# Patient Record
Sex: Male | Born: 1990 | Race: White | Hispanic: Yes | Marital: Married | Smoking: Never smoker
Health system: Southern US, Community
[De-identification: ages and names within clinical notes are randomized; demographics above are authoritative.]

## PROBLEM LIST (undated history)

## (undated) DIAGNOSIS — G4730 Sleep apnea, unspecified: Secondary | ICD-10-CM

## (undated) DIAGNOSIS — E669 Obesity, unspecified: Secondary | ICD-10-CM

## (undated) DIAGNOSIS — R519 Headache, unspecified: Secondary | ICD-10-CM

## (undated) DIAGNOSIS — K219 Gastro-esophageal reflux disease without esophagitis: Secondary | ICD-10-CM

## (undated) DIAGNOSIS — M51369 Other intervertebral disc degeneration, lumbar region without mention of lumbar back pain or lower extremity pain: Secondary | ICD-10-CM

## (undated) DIAGNOSIS — E119 Type 2 diabetes mellitus without complications: Secondary | ICD-10-CM

## (undated) DIAGNOSIS — D649 Anemia, unspecified: Secondary | ICD-10-CM

## (undated) DIAGNOSIS — M5136 Other intervertebral disc degeneration, lumbar region: Secondary | ICD-10-CM

## (undated) HISTORY — PX: NO PAST SURGERIES: SHX2092

## (undated) HISTORY — PX: VASECTOMY: SHX75

## (undated) HISTORY — PX: OTHER SURGICAL HISTORY: SHX169

---

## 2013-10-02 DIAGNOSIS — IMO0002 Reserved for concepts with insufficient information to code with codable children: Secondary | ICD-10-CM | POA: Insufficient documentation

## 2013-10-02 DIAGNOSIS — M5116 Intervertebral disc disorders with radiculopathy, lumbar region: Secondary | ICD-10-CM | POA: Insufficient documentation

## 2015-04-21 DIAGNOSIS — E119 Type 2 diabetes mellitus without complications: Secondary | ICD-10-CM | POA: Insufficient documentation

## 2015-09-17 DIAGNOSIS — M5126 Other intervertebral disc displacement, lumbar region: Secondary | ICD-10-CM | POA: Insufficient documentation

## 2016-01-29 DIAGNOSIS — Z6841 Body Mass Index (BMI) 40.0 and over, adult: Secondary | ICD-10-CM | POA: Insufficient documentation

## 2016-01-29 DIAGNOSIS — M5137 Other intervertebral disc degeneration, lumbosacral region: Secondary | ICD-10-CM | POA: Insufficient documentation

## 2016-01-29 DIAGNOSIS — R0683 Snoring: Secondary | ICD-10-CM | POA: Insufficient documentation

## 2016-01-29 DIAGNOSIS — M51379 Other intervertebral disc degeneration, lumbosacral region without mention of lumbar back pain or lower extremity pain: Secondary | ICD-10-CM | POA: Insufficient documentation

## 2016-01-30 DIAGNOSIS — E119 Type 2 diabetes mellitus without complications: Secondary | ICD-10-CM | POA: Insufficient documentation

## 2016-03-16 DIAGNOSIS — F329 Major depressive disorder, single episode, unspecified: Secondary | ICD-10-CM | POA: Insufficient documentation

## 2016-06-30 ENCOUNTER — Emergency Department (HOSPITAL_COMMUNITY): Payer: Managed Care, Other (non HMO)

## 2016-06-30 ENCOUNTER — Emergency Department (HOSPITAL_COMMUNITY)
Admission: EM | Admit: 2016-06-30 | Discharge: 2016-06-30 | Disposition: A | Discharge status: Home / Self Care | Payer: Managed Care, Other (non HMO) | Attending: Emergency Medicine | Admitting: Emergency Medicine

## 2016-06-30 ENCOUNTER — Encounter (HOSPITAL_COMMUNITY): Payer: Self-pay | Admitting: Emergency Medicine

## 2016-06-30 DIAGNOSIS — E119 Type 2 diabetes mellitus without complications: Secondary | ICD-10-CM | POA: Insufficient documentation

## 2016-06-30 DIAGNOSIS — R0789 Other chest pain: Secondary | ICD-10-CM | POA: Diagnosis not present

## 2016-06-30 DIAGNOSIS — R1011 Right upper quadrant pain: Secondary | ICD-10-CM | POA: Insufficient documentation

## 2016-06-30 HISTORY — DX: Type 2 diabetes mellitus without complications: E11.9

## 2016-06-30 HISTORY — DX: Sleep apnea, unspecified: G47.30

## 2016-06-30 HISTORY — DX: Obesity, unspecified: E66.9

## 2016-06-30 LAB — URINALYSIS, ROUTINE W REFLEX MICROSCOPIC
BILIRUBIN URINE: NEGATIVE
Glucose, UA: NEGATIVE mg/dL
Hgb urine dipstick: NEGATIVE
Ketones, ur: NEGATIVE mg/dL
LEUKOCYTES UA: NEGATIVE
NITRITE: NEGATIVE
Protein, ur: NEGATIVE mg/dL
SPECIFIC GRAVITY, URINE: 1.02 (ref 1.005–1.030)
pH: 6 (ref 5.0–8.0)

## 2016-06-30 LAB — COMPREHENSIVE METABOLIC PANEL
ALK PHOS: 60 U/L (ref 38–126)
ALT: 87 U/L — ABNORMAL HIGH (ref 17–63)
ANION GAP: 7 (ref 5–15)
AST: 41 U/L (ref 15–41)
Albumin: 3.9 g/dL (ref 3.5–5.0)
BILIRUBIN TOTAL: 0.5 mg/dL (ref 0.3–1.2)
BUN: 9 mg/dL (ref 6–20)
CALCIUM: 8.9 mg/dL (ref 8.9–10.3)
CO2: 27 mmol/L (ref 22–32)
Chloride: 104 mmol/L (ref 101–111)
Creatinine, Ser: 0.96 mg/dL (ref 0.61–1.24)
Glucose, Bld: 174 mg/dL — ABNORMAL HIGH (ref 65–99)
Potassium: 4.1 mmol/L (ref 3.5–5.1)
Sodium: 138 mmol/L (ref 135–145)
Total Protein: 6.9 g/dL (ref 6.5–8.1)

## 2016-06-30 LAB — CBC
HCT: 45.2 % (ref 39.0–52.0)
HEMOGLOBIN: 15.1 g/dL (ref 13.0–17.0)
MCH: 30.4 pg (ref 26.0–34.0)
MCHC: 33.4 g/dL (ref 30.0–36.0)
MCV: 90.9 fL (ref 78.0–100.0)
Platelets: 265 10*3/uL (ref 150–400)
RBC: 4.97 MIL/uL (ref 4.22–5.81)
RDW: 13.5 % (ref 11.5–15.5)
WBC: 12.1 10*3/uL — AB (ref 4.0–10.5)

## 2016-06-30 LAB — LIPASE, BLOOD: Lipase: 29 U/L (ref 11–51)

## 2016-06-30 MED ORDER — IOPAMIDOL (ISOVUE-300) INJECTION 61%
INTRAVENOUS | Status: AC
  Administered 2016-06-30: 100 mL via INTRAVENOUS
  Filled 2016-06-30: qty 100

## 2016-06-30 MED ORDER — HYDROMORPHONE HCL 2 MG/ML IJ SOLN
1.0000 mg | Freq: Once | INTRAMUSCULAR | Status: AC
  Administered 2016-06-30: 1 mg via INTRAVENOUS
  Filled 2016-06-30: qty 1

## 2016-06-30 MED ORDER — ONDANSETRON 4 MG PO TBDP
4.0000 mg | ORAL_TABLET | Freq: Once | ORAL | Status: AC | PRN
  Administered 2016-06-30: 4 mg via ORAL

## 2016-06-30 MED ORDER — ONDANSETRON 4 MG PO TBDP
ORAL_TABLET | ORAL | Status: AC
  Filled 2016-06-30: qty 1

## 2016-06-30 MED ORDER — FENTANYL CITRATE (PF) 100 MCG/2ML IJ SOLN
100.0000 ug | Freq: Once | INTRAMUSCULAR | Status: AC
  Administered 2016-06-30: 100 ug via INTRAVENOUS
  Filled 2016-06-30: qty 2

## 2016-06-30 MED ORDER — ONDANSETRON HCL 4 MG/2ML IJ SOLN
4.0000 mg | Freq: Once | INTRAMUSCULAR | Status: AC
  Administered 2016-06-30: 4 mg via INTRAVENOUS
  Filled 2016-06-30: qty 2

## 2016-06-30 MED ORDER — FAMOTIDINE IN NACL 20-0.9 MG/50ML-% IV SOLN
20.0000 mg | Freq: Once | INTRAVENOUS | Status: AC
  Administered 2016-06-30: 20 mg via INTRAVENOUS
  Filled 2016-06-30: qty 50

## 2016-06-30 NOTE — ED Notes (Signed)
Pt had "reaction" to fentanyl; pt got very tearful, anxious, and stated he was feeling very sad.  Has had this same "reaction" to dilaudid.  Sts he reacts this way to all narcotics and did not complete narcotic prescriptions when given them in the past.

## 2016-06-30 NOTE — ED Provider Notes (Signed)
[facility]ENC[facility] DEPT Provider Note   CSN: [id number] Arrival date & time: 06/30/16  0535     History   Chief Complaint Chief Complaint  Patient presents with  . Abdominal Pain    RUQ    HPI Deleon Boesen is a 26 y.o. male.  The history is provided by the patient and a significant other.  Abdominal Pain   This is a new problem. The current episode started 3 to 5 hours ago. The problem occurs constantly. The problem has been rapidly worsening. The pain is associated with an unknown factor. The pain is located in the RUQ. The pain is severe. Associated symptoms include nausea. Pertinent negatives include fever and vomiting. The symptoms are aggravated by palpation. Nothing relieves the symptoms.  Patient reports abrupt onset of RUQ pain tonight while sleeping It radiates into his right shoulder He reports intense pain and nausea He reports he feels like he was "punched" in the liver He has never had this before He does not abuse ETOH   Past Medical History:  Diagnosis Date  . Diabetes mellitus without complication (HCC)   . Obese   . Sleep apnea     There are no active problems to display for this patient.   History reviewed. No pertinent surgical history.     Home Medications    Prior to Admission medications   Not on File    Family History History reviewed. No pertinent family history.  Social History Social History  Substance Use Topics  . Smoking status: Never Smoker  . Smokeless tobacco: Never Used  . Alcohol use Yes     Comment: socially     Allergies   Patient has no known allergies.   Review of Systems Review of Systems  Constitutional: Negative for fever.  Respiratory: Positive for cough.   Gastrointestinal: Positive for abdominal pain and nausea. Negative for vomiting.  All other systems reviewed and are negative.    Physical Exam Updated Vital Signs BP (!) 147/107 (BP Location: Left Arm)   Pulse 103   Temp 98.7 F (37.1 C)  (Oral)   Resp 22   Ht 5\' 8"  (1.727 m)   Wt (!) 158.8 kg   SpO2 100%   BMI 53.22 kg/m   Physical Exam CONSTITUTIONAL: Well developed/well nourished, uncomfortable appearing, writhing in the bed HEAD: Normocephalic/atraumatic EYES: EOMI/PERRL, no icterus ENMT: Mucous membranes moist NECK: supple no meningeal signs SPINE/BACK:entire spine nontender CV: S1/S2 noted, no murmurs/rubs/gallops noted LUNGS: Lungs are clear to auscultation bilaterally, no apparent distress ABDOMEN: soft, moderate RUQ tenderness, +murphy sign, no rebound or guarding, bowel sounds noted throughout abdomen GU:no cva tenderness NEURO: Pt is awake/alert/appropriate, moves all extremitiesx4.  No facial droop.   EXTREMITIES: pulses normal/equal, full ROM SKIN: warm, color normal PSYCH: anxious  ED Treatments / Results  Labs (all labs ordered are listed, but only abnormal results are displayed) Labs Reviewed  COMPREHENSIVE METABOLIC PANEL - Abnormal; Notable for the following:       Result Value   Glucose, Bld 174 (*)    ALT 87 (*)    All other components within normal limits  CBC - Abnormal; Notable for the following:    WBC 12.1 (*)    All other components within normal limits  LIPASE, BLOOD  URINALYSIS, ROUTINE W REFLEX MICROSCOPIC    EKG  EKG Interpretation None       Radiology No results found.  Procedures Procedures  Medications Ordered in ED Medications  ondansetron (ZOFRAN-ODT) disintegrating tablet 4  mg (4 mg Oral Given 06/30/16 0543)  fentaNYL (SUBLIMAZE) injection 100 mcg (100 mcg Intravenous Given 06/30/16 0651)  ondansetron (ZOFRAN) injection 4 mg (4 mg Intravenous Given 06/30/16 [id number])     Initial Impression / Assessment and Plan / ED Course  I have reviewed the triage vital signs and the nursing notes.  Pertinent labs results that were available during my care of the patient were reviewed by me and considered in my medical decision making (see chart for details).    7:27  AM At signout to dr Adela Lankfloyd, f/u on US imaging as patient with likely biliary colic   Final Clinical Impressions(s) / ED Diagnoses   Final diagnoses:  RUQ pain    New Prescriptions New Prescriptions   No medications on file     Zadie Rhineonald Judeen Geralds, MD 06/30/16 [phone]

## 2016-06-30 NOTE — ED Triage Notes (Signed)
Pt presents with acute onset RUQ abd pain at 3am; pt states "I feel like I have been punched in the liver"; pt also reports some nausea on the way here

## 2016-06-30 NOTE — Discharge Instructions (Signed)
Take 4 over the counter ibuprofen tablets 3 times a day or 2 over-the-counter naproxen tablets twice a day for pain. Also take tylenol 1000mg(2 extra strength) four times a day.    

## 2016-06-30 NOTE — ED Notes (Signed)
Returned from xray

## 2016-06-30 NOTE — ED Notes (Signed)
Transported to CT 

## 2016-06-30 NOTE — ED Provider Notes (Signed)
I received the patient in signout from Dr. Bebe ShaggyWickline. Briefly patient is a 26 year old male with sudden onset right upper quadrant abdominal pain radiating to his back. This woke him up from sleep. Never had pain like this before. Plan was to follow-up on right upper quadrant ultrasound. This study was negative. Patient still with severe right upper quadrant tenderness. Will obtain a CT scan with contrast. Redosed pain medicines. Will give Pepcid.  Patient with some symptomatic improvement, CT negative.  Able to tolerate PO.  D/c home.     I have discussed the diagnosis/risks/treatment options with the patient and believe the pt to be eligible for discharge home to follow-up with PCP. We also discussed returning to the ED immediately if new or worsening sx occur. We discussed the sx which are most concerning (e.g., sudden worsening pain, fever, inability to tolerate by mouth) that necessitate immediate return. Medications administered to the patient during their visit and any new prescriptions provided to the patient are listed below.  Medications given during this visit Medications  ondansetron (ZOFRAN-ODT) disintegrating tablet 4 mg (4 mg Oral Given 06/30/16 0543)  fentaNYL (SUBLIMAZE) injection 100 mcg (100 mcg Intravenous Given 06/30/16 0651)  ondansetron (ZOFRAN) injection 4 mg (4 mg Intravenous Given 06/30/16 0648)  HYDROmorphone (DILAUDID) injection 1 mg (1 mg Intravenous Given 06/30/16 0837)  ondansetron (ZOFRAN) injection 4 mg (4 mg Intravenous Given 06/30/16 0838)  famotidine (PEPCID) IVPB 20 mg premix (0 mg Intravenous Stopped 06/30/16 1002)  iopamidol (ISOVUE-300) 61 % injection (100 mLs Intravenous Contrast Given 06/30/16 0912)     The patient appears reasonably screen and/or stabilized for discharge and I doubt any other medical condition or other [facility] requiring further screening, evaluation, or treatment in the ED at this time prior to discharge.     Melene Planan Edythe Riches, DO 06/30/16 1503

## 2017-03-04 DIAGNOSIS — G4733 Obstructive sleep apnea (adult) (pediatric): Secondary | ICD-10-CM | POA: Insufficient documentation

## 2017-06-22 IMAGING — US US ABDOMEN LIMITED
1 series · 14 of 25 positions shown · non-contrast
Comparison: None.

CLINICAL DATA: Right upper quadrant pain and nausea since 3 a.m.
this morning.

EXAM:
US ABDOMEN LIMITED - RIGHT UPPER QUADRANT

[Series 1: us abdomen limited · 0.26mm/px · 14 of 37 slices shown]
[im 1/37]
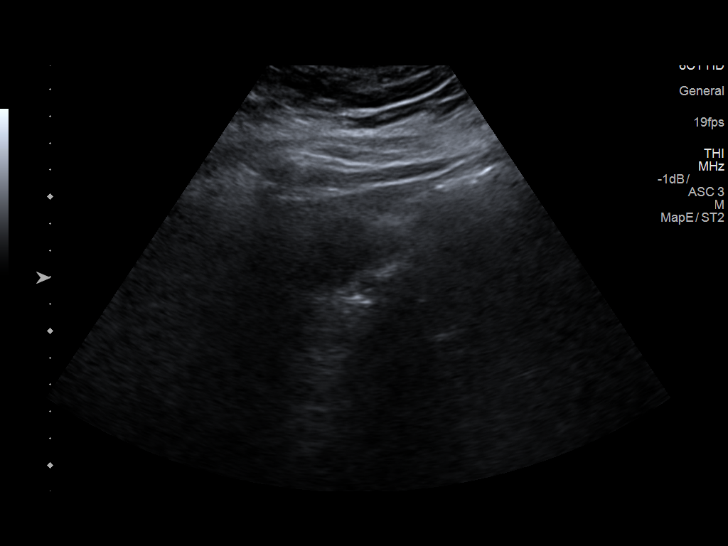
[im 4/37]
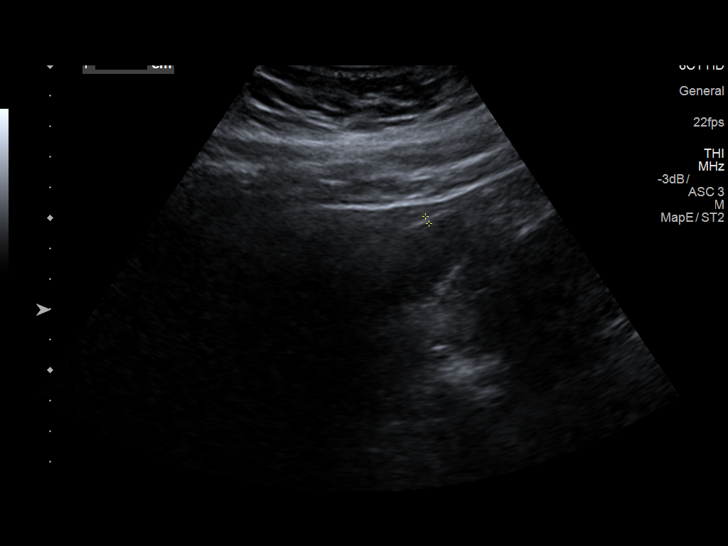
[im 7/37]
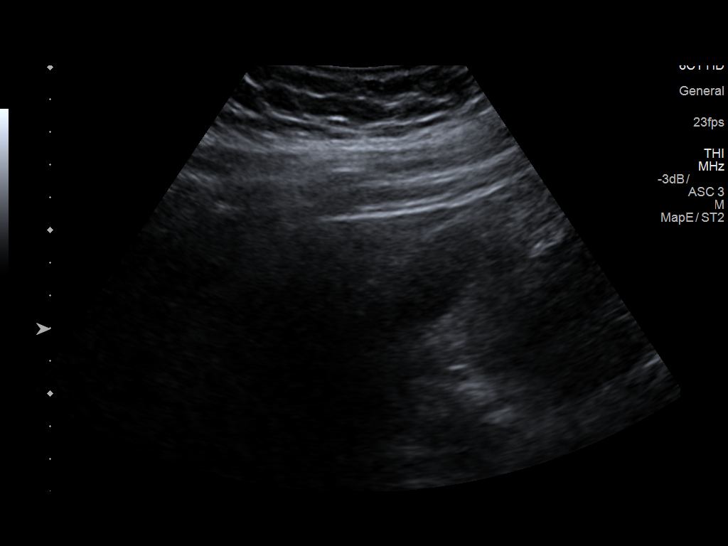
[im 10/37]
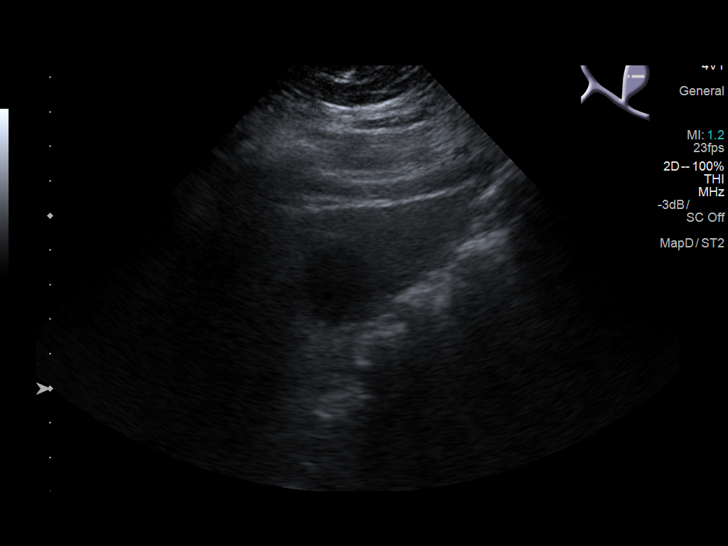
[im 13/37]
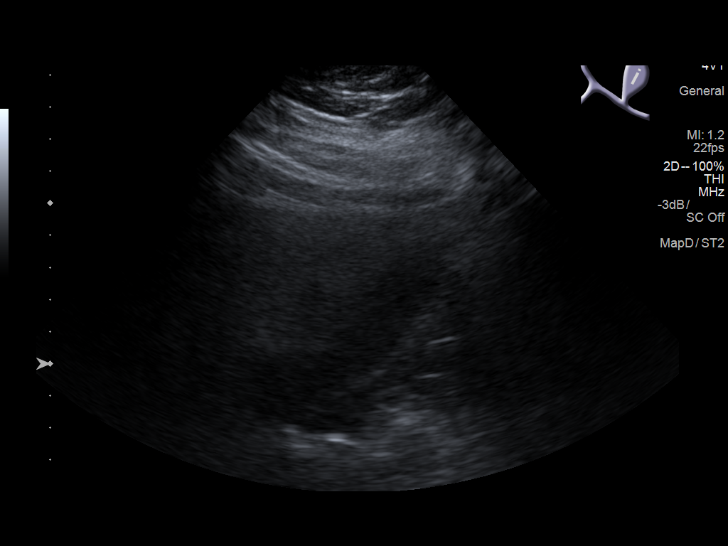
[im 14/37]
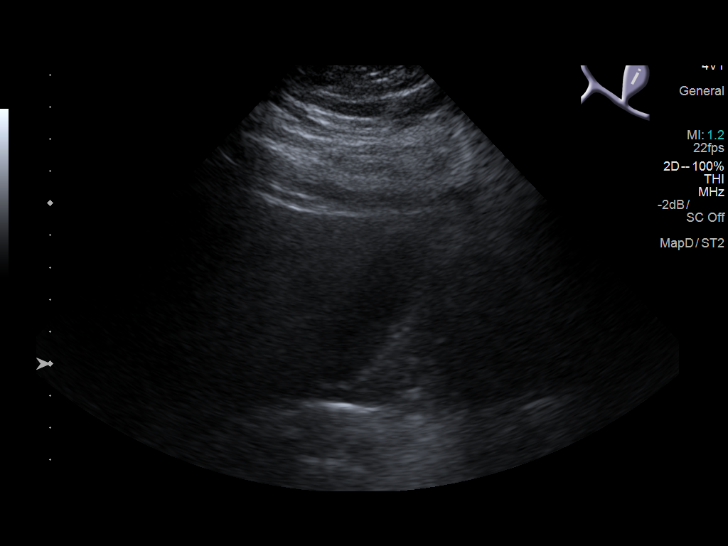
[im 17/37]
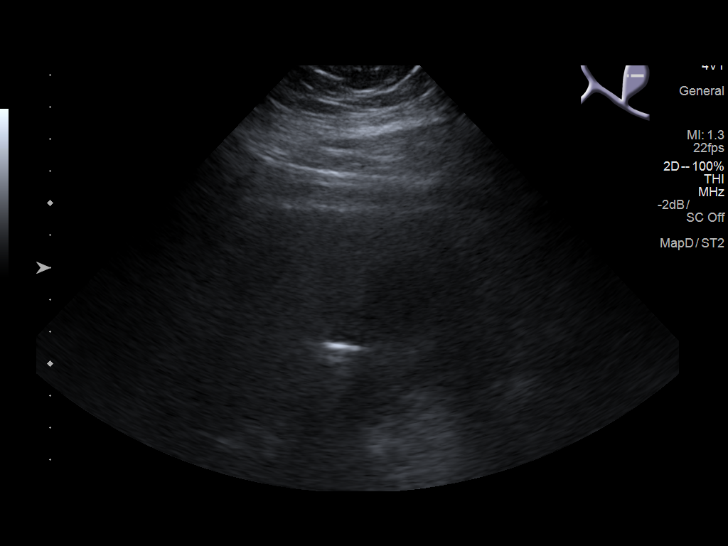
[im 20/37]
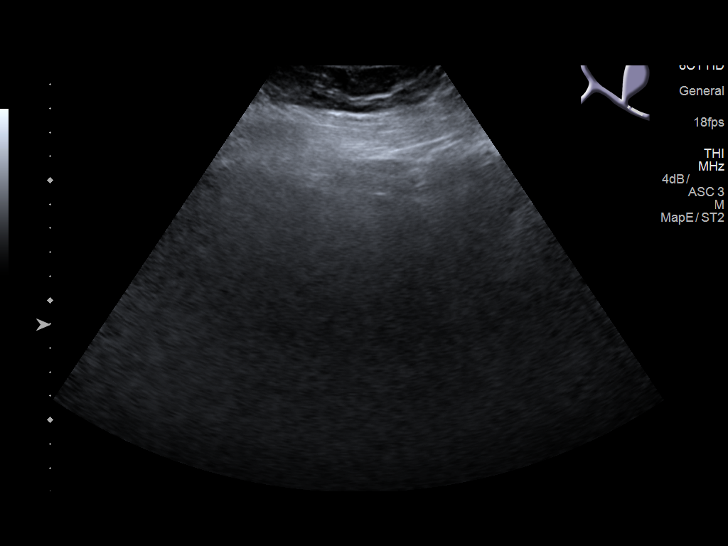
[im 23/37]
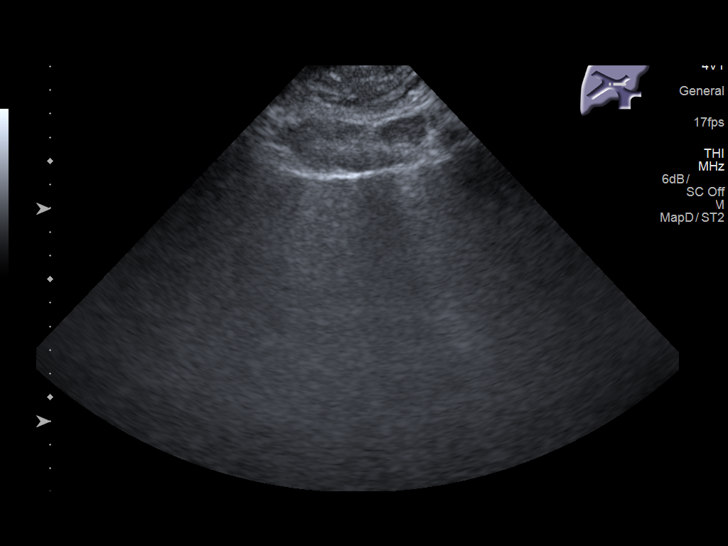
[im 25/37]
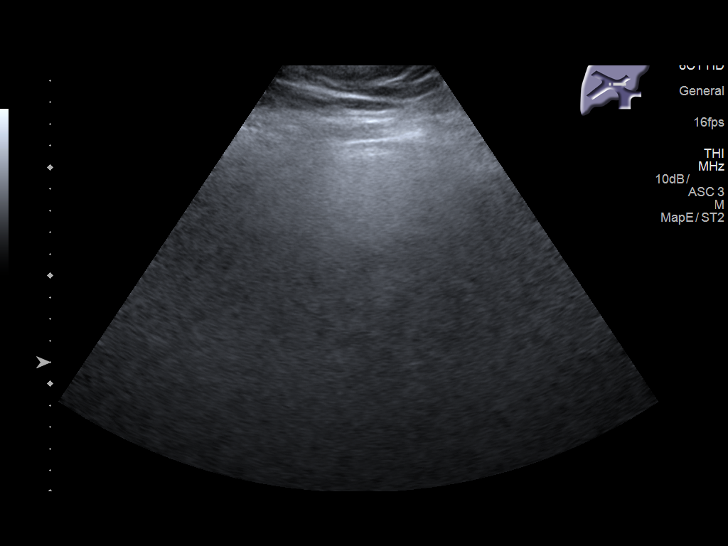
[im 28/37]
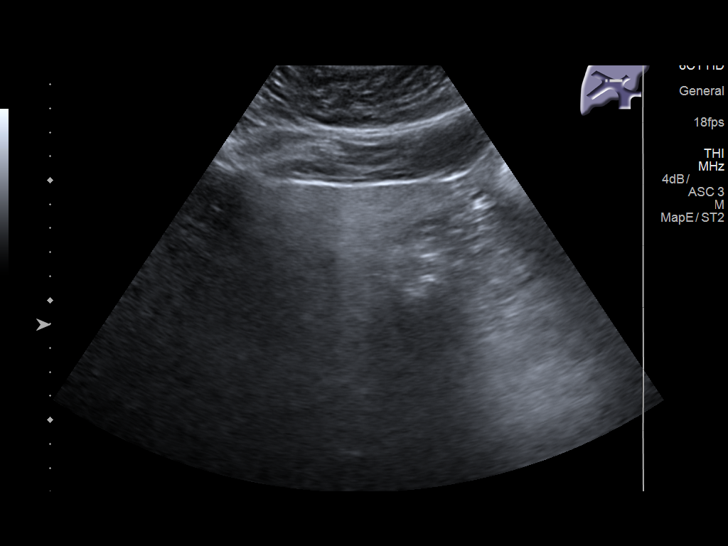
[im 31/37]
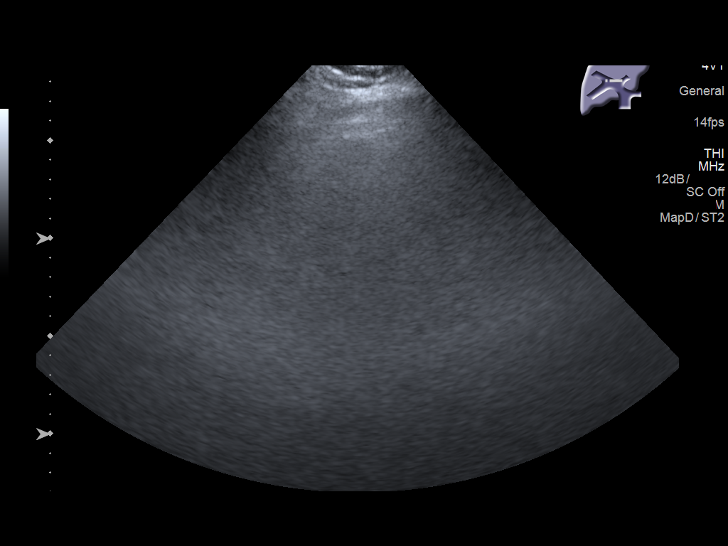
[im 34/37]
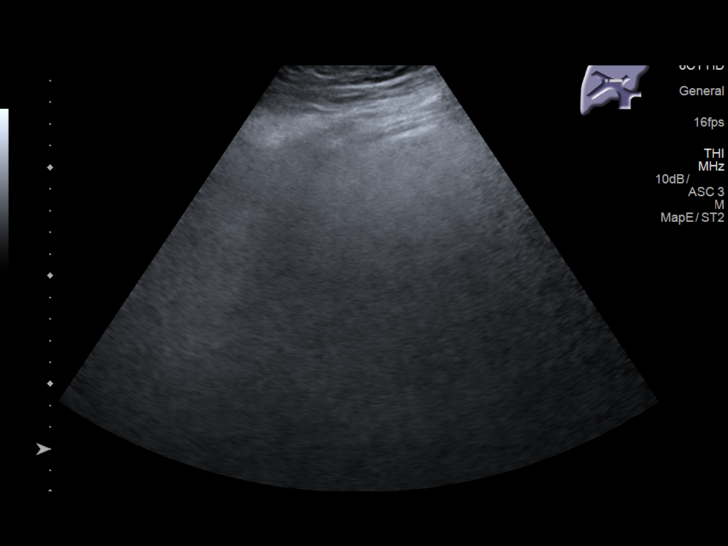
[im 37/37]
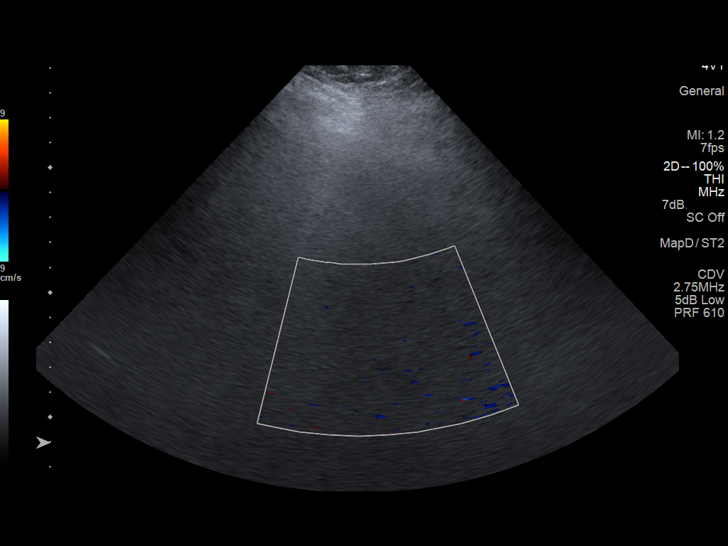

[14 of 25 positions shown; findings below may reference images not displayed]

FINDINGS: Gallbladder:

No gallstones or wall thickening visualized. No sonographic Murphy
sign noted by sonographer.

Common bile duct:

Not visualized.

Liver:

The liver is dense and demonstrates increased echogenicity.
IMPRESSION: Limited study due to the patient's body habitus.

Negative for gallstones or acute abnormality.

Fatty infiltration of the liver.

## 2018-11-13 IMAGING — CT CT ABD-PELV W/ CM
2 of 4 series · 9 of 46 positions shown, 10 images · IV contrast (Iodine)
Comparison: None.

CLINICAL DATA: Severe right upper quadrant pain radiating to right
lower quadrant

EXAM:
CT ABDOMEN AND PELVIS WITH CONTRAST
TECHNIQUE: Multidetector CT imaging of the abdomen and pelvis was performed
using the standard protocol following bolus administration of
intravenous contrast.
CONTRAST:  100 mL Isovue 300 IV

[Series 201: routine, idose (2) · axial · 0.96mm/px · z∈[+114,+499]mm · 6 of 93 slices shown, 7 images]
[im 8/93  soft-tissue]
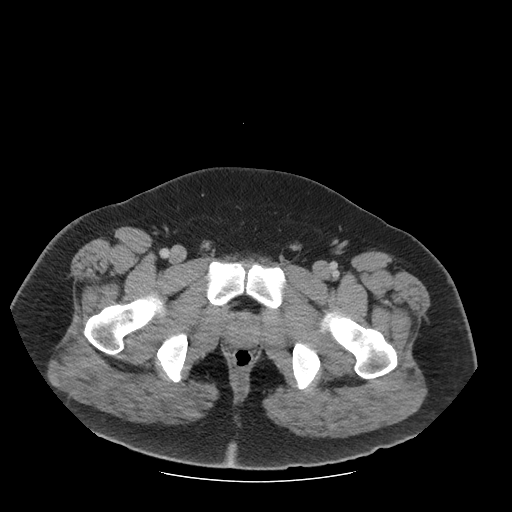
[im 8/93  bone]
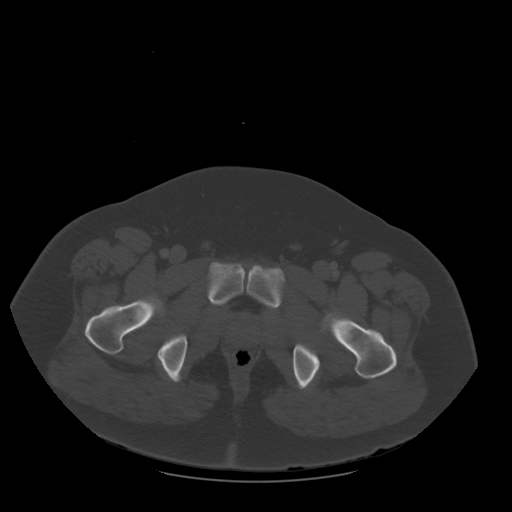
[im 24/93  soft-tissue]
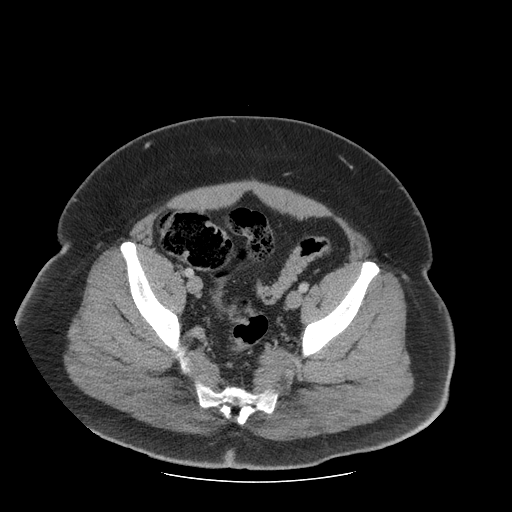
[im 39/93  soft-tissue]
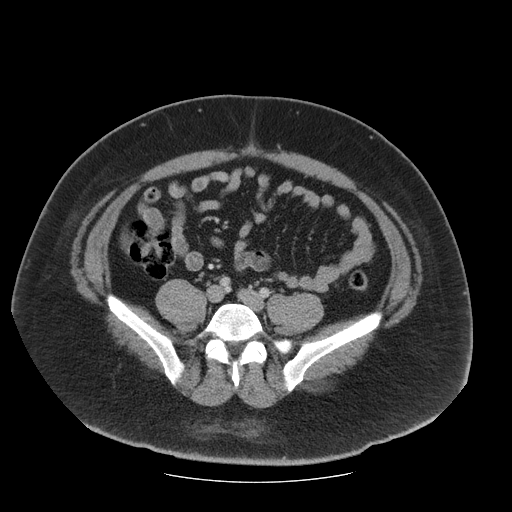
[im 54/93  soft-tissue]
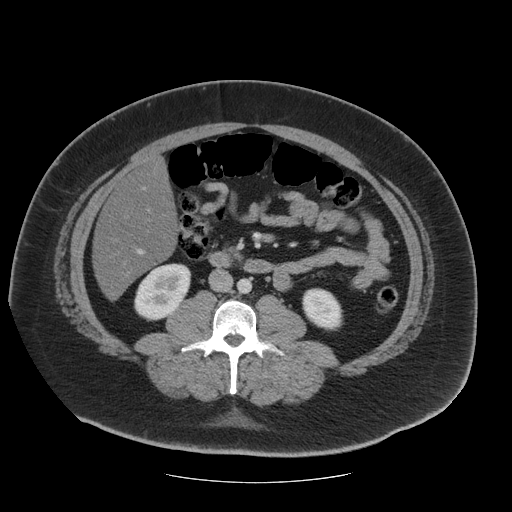
[im 70/93  soft-tissue]
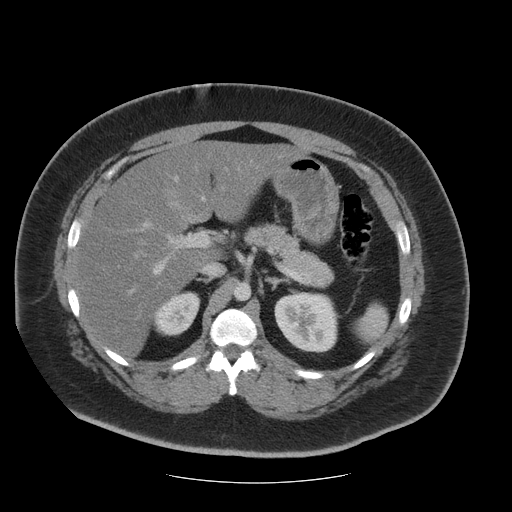
[im 85/93  soft-tissue]
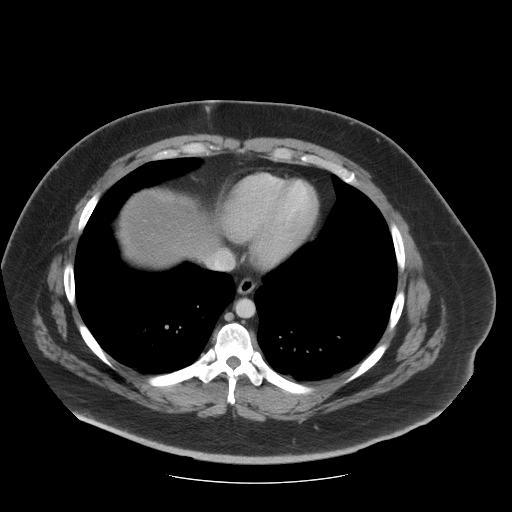

[Series 203: coronals, idose (2) · coronal · 0.45mm/px · 3 of 168 slices shown]
[im 56/168  soft-tissue]
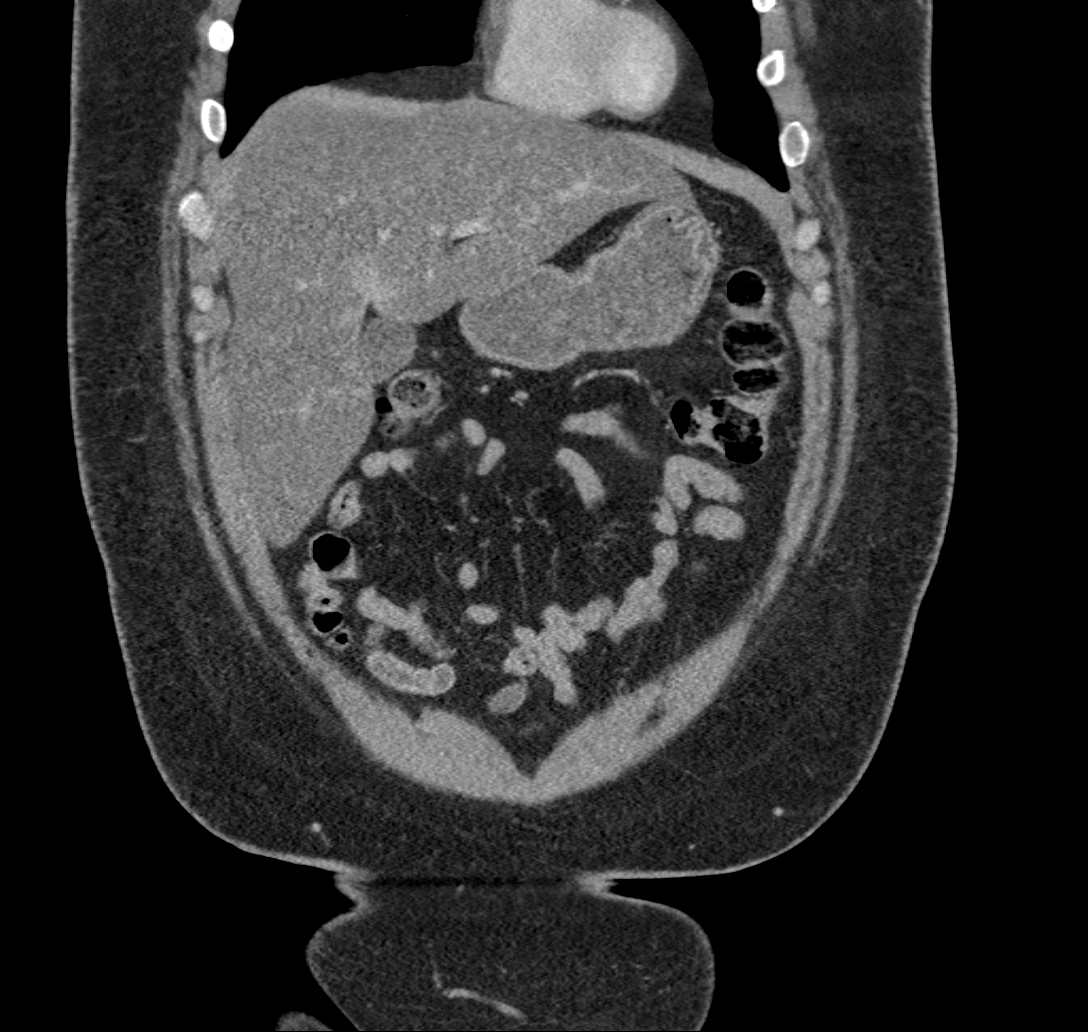
[im 75/168  soft-tissue]
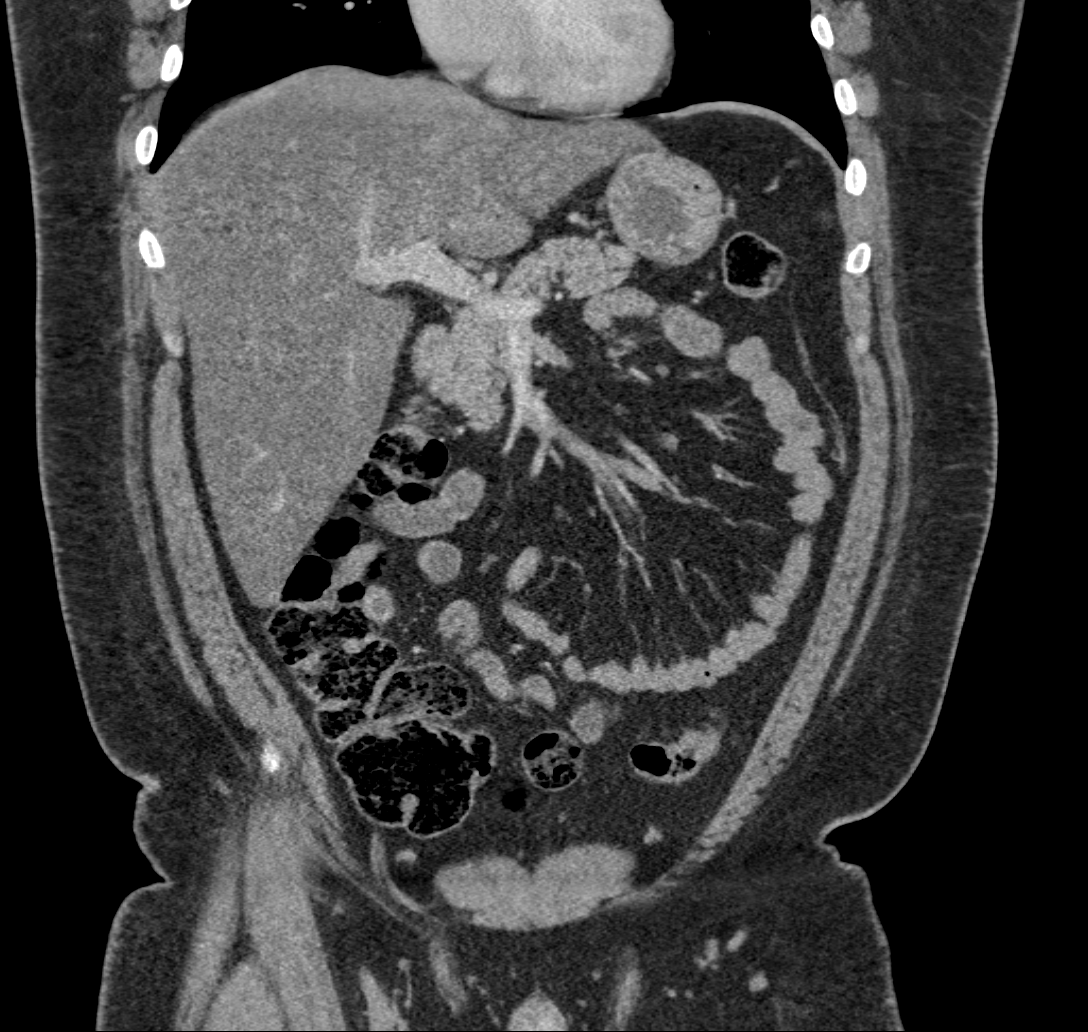
[im 93/168  soft-tissue]
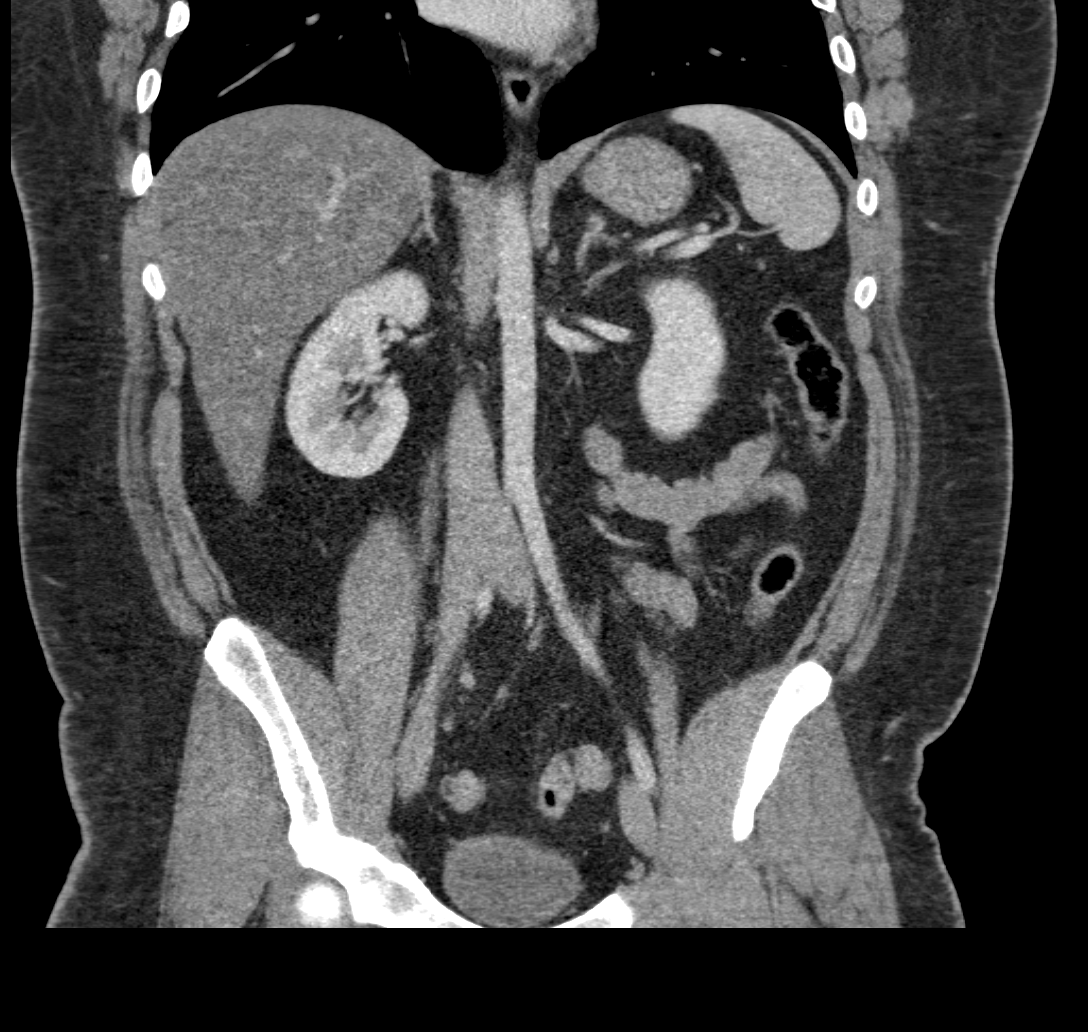

[9 of 46 positions shown; findings below may reference images not displayed]

FINDINGS: Lower chest: Lung bases are clear.

Hepatobiliary: Severe hepatic steatosis with focal fatty sparing
along the gallbladder fossa.

Gallbladder is unremarkable. No intrahepatic or extrahepatic ductal
dilatation.

Pancreas: Within normal limits.

Spleen: Within normal limits.

Adrenals/Urinary Tract: Adrenal glands within normal limits.

7 mm right lower pole renal cyst (series 201/ image 36). Left kidney
is within normal limits. No hydronephrosis.

Bladder is within normal limits.

Stomach/Bowel: Stomach is within normal limits.

No evidence of bowel obstruction.

Normal appendix (series 201/ image 61).

Left colon is decompressed.

Vascular/Lymphatic: No evidence of abdominal aortic aneurysm.

No suspicious abdominopelvic lymphadenopathy.

Reproductive: Prostate is unremarkable.

Other: No abdominopelvic ascites.

Tiny fat containing left inguinal hernia.

Musculoskeletal: Visualized osseous structures are within normal
limits.
IMPRESSION: No evidence of bowel obstruction.  Normal appendix.

No CT findings to account for the patient's right abdominal pain.

Severe hepatic steatosis with focal fatty sparing.

## 2021-08-04 NOTE — Progress Notes (Signed)
? ?08/06/2021 ?1:16 PM  ? ?Scott Mayo ?04-Aug-1990 ?[id number] ? ?Referring provider: Elba Barman, MD ?[address] ?[place],  [place] [address] ? ?Chief Complaint  ?Patient presents with  ? VAS Consult  ?  New Patient  ? ? ? ?HPI: ?Scott Mayo is a 31 y.o. male who presents today for further evaluation of vasectomy.  ? ?He denies a history of testicular pain.  No urinary issues.  No previous scrotal surgeries. ? ?He reports that he had an accident when he was in highschool playing lacrosse and he had impact to his testicles.  ?  ?He has 2 children.  ? ?PMH: ?Past Medical History:  ?Diagnosis Date  ? Diabetes mellitus without complication ([place])   ? Obese   ? Sleep apnea   ? ? ?Surgical History: ?Past Surgical History:  ?Procedure Laterality Date  ? none    ? ? ?Home Medications:  ?Allergies as of 08/06/2021   ?No Known Allergies ?  ? ?  ?Medication List  ?  ? ?  ? Accurate as of August 06, 2021  1:16 PM. If you have any questions, ask your nurse or doctor.  ?  ?  ? ?  ? ?STOP taking these medications   ? ?atorvastatin 10 MG tablet ?Commonly known as: LIPITOR ?Stopped by: Hollice Espy, MD ?  ?buPROPion 150 MG 24 hr tablet ?Commonly known as: WELLBUTRIN XL ?Stopped by: Hollice Espy, MD ?  ?metFORMIN 500 MG 24 hr tablet ?Commonly known as: GLUCOPHAGE-XR ?Stopped by: Hollice Espy, MD ?  ? ?  ? ?TAKE these medications   ? ?freestyle lancets ?CHECK FASTING BLOOD SUGAR D ?  ?Jardiance 25 MG Tabs tablet ?Generic drug: empagliflozin ?Take 25 mg by mouth daily. ?  ?Victoza 18 MG/3ML Sopn ?Generic drug: liraglutide ?Inject 1.2 mg into the skin daily. ?  ? ?  ? ? ?Allergies: No Known Allergies ? ?Family History: ?Family History  ?Problem Relation Age of Onset  ? Prostate cancer Neg Hx   ? Bladder Cancer Neg Hx   ? Kidney cancer Neg Hx   ? ? ?Social History:  reports that he has never smoked. He has never used smokeless tobacco. He reports current alcohol use. He reports that he does not use  drugs. ? ? ?Physical Exam: ?BP 126/86   Pulse 75   Ht 5\' 10"  (1.778 m)   Wt 279 lb (126.6 kg)   BMI 40.03 kg/m?   ?Constitutional:  Alert and oriented, No acute distress. ?HEENT: [place] AT, moist mucus membranes.  Trachea midline, no masses. ?Cardiovascular: No clubbing, cyanosis, or edema. ?Respiratory: Normal respiratory effort, no increased work of breathing. ?GI: Abdomen is soft, nontender, nondistended, no abdominal masses ?GU: Normal phallus.  Bilateral descended testicles without masses.  Vasa easily palpable bilaterally. 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 ?Neurologic: Grossly intact, no focal deficits, moving all 4 extremities. ?Psychiatric: Normal mood and affect. ? ?Discussed that  ?Assessment & Plan:   ?1. Vasectomy evaluation ?Today, we discussed what the vas deferens is, where it is located, and its function. We reviewed the procedure for vasectomy, it's risks, benefits, alternatives, and likelihood of achieving his goals. We discussed in detail the procedure, complications, and recovery as well as the need for clearance prior to unprotected intercourse. We discussed that vasectomy does not protect against sexually transmitted diseases. We discussed that this procedure does not result  in immediate sterility and that they would need to use other forms of birth control until he has been cleared with negative postvasectomy semen analyses. I explained that the procedure is considered to be permanent and that attempts at reversal have varying degrees of success. These options include vasectomy reversal, sperm retrieval, and in vitro fertilization; these can be very expensive. We discussed the chance of postvasectomy pain syndrome which occurs in less than 5% of patients. I explained to the patient that there is no treatment to resolve this chronic pain, and that if it developed I  would not be able to help resolve the issue, but that surgery is generally not needed for correction. I explained there have even been reports of systemic like illness associated with this chronic pain, and that there was no good cure. I explained that vasectomy it is not a 100% reliable form of birth control, and the risk of pregnancy after vasectomy is approximately 1 in 2000 men who had a negative postvasectomy semen analysis or rare non-motile sperm. I explained that repeat vasectomy was necessary in less than 1% of vasectomy procedures when employing the type of technique that I use. I explained that he should refrain from ejaculation for approximately one week following vasectomy. I explained that there are other options for birth control which are permanent and non-permanent; we discussed these. I explained the rates of surgical complications, such as symptomatic hematoma or infection, are low (1-2%) and vary with the surgeon's experience and criteria used to diagnose the complication.  ? ?The patient had the opportunity to ask questions to his stated satisfaction. He voiced understanding of the above factors and stated that he has read all the information provided to him and the packets and informed consent. ? ?He is interested in receiving of Valium 10 mg prior to the procedure for the purpose of anxiolysis.  A prescription was given today.  He will have a driver on the day of the procedure. ? ?- On exam his anatomy was somewhat challenging ultimately was able to locate sperm duct. Offered him attempt at office with understanding there is a risk of failure versus in the OR. He would like to try it in the office.  ? ?Return for VAS. ? ?Conley Rolls as a scribe for Hollice Espy, MD.,have documented all relevant documentation on the behalf of Hollice Espy, MD,as directed by  Hollice Espy, MD while in the presence of Hollice Espy, MD. ? ? ?[facility] ?[address], [address] ?[place], [place] [address] ?[phone] ?

## 2021-08-06 ENCOUNTER — Encounter: Payer: Self-pay | Admitting: Urology

## 2021-08-06 ENCOUNTER — Ambulatory Visit: Payer: BC Managed Care – PPO | Admitting: Urology

## 2021-08-06 VITALS — BP 126/86 | HR 75 | Ht 70.0 in | Wt 279.0 lb

## 2021-08-06 DIAGNOSIS — Z3009 Encounter for other general counseling and advice on contraception: Secondary | ICD-10-CM

## 2021-08-06 MED ORDER — DIAZEPAM 10 MG PO TABS: 10.0000 mg | ORAL_TABLET | Freq: Once | ORAL | 0 refills | Status: AC

## 2021-08-06 NOTE — Patient Instructions (Signed)

## 2021-08-28 ENCOUNTER — Ambulatory Visit: Payer: Managed Care, Other (non HMO) | Admitting: Urology

## 2021-08-28 ENCOUNTER — Encounter: Payer: Self-pay | Admitting: Urology

## 2021-08-28 ENCOUNTER — Other Ambulatory Visit: Payer: Self-pay | Admitting: Urology

## 2021-08-28 VITALS — BP 134/93 | HR 69 | Ht 70.0 in | Wt 276.0 lb

## 2021-08-28 DIAGNOSIS — Z302 Encounter for sterilization: Secondary | ICD-10-CM

## 2021-08-28 DIAGNOSIS — Z3009 Encounter for other general counseling and advice on contraception: Secondary | ICD-10-CM

## 2021-08-28 NOTE — Progress Notes (Signed)
Surgical Physician Order Form [facility] ? ?* Scheduling expectation :  ~6 weeks ? ?*Length of Case:  ? ?*Clearance needed: no ? ?*Anticoagulation Instructions: N/A ? ?*Aspirin Instructions: Hold Aspirin ? ?*Post-op visit Date/Instructions:   NA ? ?*Diagnosis: Desires vasectomy, difficult vasectomy ? ?*Procedure: right Vasectomy [id number]) ? ? ?Additional orders: N/A ? ?-Admit type: OUTpatient ? ?-Anesthesia: General ? ?-VTE Prophylaxis Standing Order SCD?s    ?   ?Other:  ? ?-Standing Lab Orders Per Anesthesia   ? ?Lab other: None ? ?-Standing Test orders EKG/Chest x-ray per Anesthesia      ? ?Test other:  ? ?- Medications:  Ancef 2gm IV ? ?-Other orders:  OK for Friday in [facility] or Monday at [facility] ? ? ? ?  ? ?

## 2021-08-28 NOTE — Patient Instructions (Signed)

## 2021-08-28 NOTE — Progress Notes (Signed)
08/28/2021 ? ?CC:  ?Chief Complaint  ?Patient presents with  ? VAS  ? ? ?HPI: ?Scott Mayo is a 31 y.o. male who presents today for a cystoscopy.  ? ?He denies a history of testicular pain.  No urinary issues.  No previous scrotal surgeries. ?  ?He reports that he had an accident when he was in highschool playing lacrosse and he had impact to his testicles.  ?  ?He has 2 children.  ? ?Vitals:  ? 08/28/21 1110  ?BP: (!) 134/93  ?Pulse: 69  ? ?NED. A&Ox3.   ?No respiratory distress   ?Abd soft, NT, ND ?Normal external genitalia with patent urethral meatus ? ?Timeout was performed.  Consent was confirmed. ? ? ?Bilateral Vasectomy Procedure ? ?Pre-Procedure: ?- Patient's scrotum was prepped and draped for vasectomy. ?- The vas was palpated through the scrotal skin on the left. ?- 1% Xylocaine was injected into the skin and surrounding tissue for placement  ?- In a similar manner, the vas on the right was identified, anesthetized, and stabilized. ? ?Procedure: ?- A #11 blade was used to make a small stab incision in the skin overlying the vas ?- The left vas was isolated and brought up through the incision exposing that structure. ?- Bleeding points were cauterized as they occurred. ?- The vas was free from the surrounding structures and brought to the view. ?- A segment was positioned for placement with a hemostat. ?- A second hemostat was placed and a small segment between the two hemostats and was removed for inspection. ?- Each end of the transected vas lumen was fulgurated/ obliterated using needlepoint electrocautery ?-A fascial interposition was performed on testicular end of the vas using #3-0 chromic suture ?-The same procedure was performed on the right. ?- A single suture of #3-0 chromic catgut was used to close each lateral scrotal skin incision ?- A dressing was applied. ?- On the right side, struggled to palpate and isolate vas despite multiple manuevers only successful on the left side. We discussed  proceeding to OR for  right sided vasectomy under anesthetis versus more dissective approach. He is agreeable with this plan.  ? ? ?I have reviewed the above documentation for accuracy and completeness, and I agree with the above.  ? ?Vanna Scotland, MD ? ? ? ?

## 2021-08-29 ENCOUNTER — Telehealth: Payer: Self-pay

## 2021-08-29 ENCOUNTER — Other Ambulatory Visit: Payer: Self-pay | Admitting: Urology

## 2021-08-29 MED ORDER — TRAMADOL HCL 50 MG PO TABS: 50.0000 mg | ORAL_TABLET | Freq: Four times a day (QID) | ORAL | 0 refills | Status: DC | PRN

## 2021-08-29 NOTE — Telephone Encounter (Signed)
I spoke with Scott Mayo. We have discussed possible surgery dates and Monday July 10th, 2023 was agreed upon by all parties. Patient given information about surgery date, what to expect pre-operatively and post operatively.  ?We discussed that a Pre-Admission Testing office will be calling to set up the pre-op visit that will take place prior to surgery, and that these appointments are typically done over the phone with a Pre-Admissions RN.  ? ?Informed patient that our office will communicate any additional care to be provided after surgery. Patients questions or concerns were discussed during our call. Advised to call our office should there be any additional information, questions or concerns that arise. Patient verbalized understanding.  ? ?

## 2021-08-29 NOTE — Telephone Encounter (Signed)
Patient states that he woke up this morning and is experincing worse pain on his right testicle, he said that he has been taking tylenol and ibuprofen without relief. Wanted to know if you could send in some pain medication for him.  ?

## 2021-08-29 NOTE — Progress Notes (Signed)
[facility] Posting Form  ? ?Surgery Date/Time: Date: 10/27/2021 ? ?Surgeon: Dr. Vanna Scotland, MD ? ?Surgery Location: Day Surgery ? ?Inpt ( No  )   Outpt (Yes)   Obs ( No  )  ? ?Diagnosis: Z30.2 ? ?-CPT: 55250 ? ?Surgery: Right Vasectomy  ? ?Stop Anticoagulations: Yes, hold ASA ? ?Cardiac/Medical/Pulmonary Clearance needed: no ? ?*Orders entered into EPIC  Date: 08/29/21  ? ?*Case booked in [place]  Date: 08/29/21 ? ?*Notified pt of Surgery: Date: 08/29/21 ? ?PRE-OP UA & CX: no ? ?*Placed into Prior Authorization Work Que Date: 08/29/21 ? ?Assistant/laser/rep:No ? ? ? ? ? ? ? ? ? ? ? ? ? ? ? ?

## 2021-10-17 ENCOUNTER — Encounter
Admission: RE | Admit: 2021-10-17 | Discharge: 2021-10-17 | Disposition: A | Discharge status: Home / Self Care | Payer: BC Managed Care – PPO | Source: Ambulatory Visit | Attending: Urology | Admitting: Urology

## 2021-10-17 DIAGNOSIS — Z01818 Encounter for other preprocedural examination: Secondary | ICD-10-CM

## 2021-10-17 DIAGNOSIS — E119 Type 2 diabetes mellitus without complications: Secondary | ICD-10-CM

## 2021-10-17 DIAGNOSIS — Z01810 Encounter for preprocedural cardiovascular examination: Secondary | ICD-10-CM | POA: Diagnosis not present

## 2021-10-17 HISTORY — DX: Gastro-esophageal reflux disease without esophagitis: K21.9

## 2021-10-17 HISTORY — DX: Headache, unspecified: R51.9

## 2021-10-17 HISTORY — DX: Anemia, unspecified: D64.9

## 2021-10-17 HISTORY — DX: Other intervertebral disc degeneration, lumbar region: M51.36

## 2021-10-17 HISTORY — DX: Other intervertebral disc degeneration, lumbar region without mention of lumbar back pain or lower extremity pain: M51.369

## 2021-10-17 NOTE — Patient Instructions (Signed)
Your procedure is scheduled on:10-27-21 Monday Report to the Registration Desk on the 1st floor of the [facility]. To find out your arrival time, please call [phone] between 1PM - 3PM on:10-24-21 Friday If your arrival time is 6:00 am, do not arrive prior to that time as the [facility] entrance doors do not open until 6:00 am.  REMEMBER: Instructions that are not followed completely may result in serious medical risk, up to and including death; or upon the discretion of your surgeon and anesthesiologist your surgery may need to be rescheduled.  Do not eat food OR drink any liquids after midnight the night before surgery.  No gum chewing, lozengers or hard candies  Do NOT take any medication the day of surgery  Stop your JARDIANCE 3 days prior to surgery-Last dose will be on 10-23-21 Thursday  One week prior to surgery: Stop Anti-inflammatories (NSAIDS) such as Advil, Aleve, Ibuprofen, Motrin, Naproxen, Naprosyn and Aspirin based products such as Excedrin, Goodys Powder, BC Powder.You may however, take Tylenol if needed for pain up until the day of surgery.  Stop ANY OVER THE COUNTER supplements/vitamins 7 days prior to surgery  No Alcohol for 24 hours before or after surgery.  No Smoking including e-cigarettes for 24 hours prior to surgery.  No chewable tobacco products for at least 6 hours prior to surgery.  No nicotine patches on the day of surgery.  Do not use any "recreational" drugs for at least a week prior to your surgery.  Please be advised that the combination of cocaine and anesthesia may have negative outcomes, up to and including death. If you test positive for cocaine, your surgery will be cancelled.  On the morning of surgery brush your teeth with toothpaste and water, you may rinse your mouth with mouthwash if you wish. Do not swallow any toothpaste or mouthwash.  Do not wear jewelry, make-up, hairpins, clips or nail polish.  Do not wear lotions, powders, or  perfumes.   Do not shave body from the neck down 48 hours prior to surgery just in case you cut yourself which could leave a site for infection.  Also, freshly shaved skin may become irritated if using the CHG soap.  Contact lenses, hearing aids and dentures may not be worn into surgery.  Do not bring valuables to the hospital. [facility] is not responsible for any missing/lost belongings or valuables.   Bring your C-PAP to the hospital with you   Notify your doctor if there is any change in your medical condition (cold, fever, infection).  Wear comfortable clothing (specific to your surgery type) to the hospital.  After surgery, you can help prevent lung complications by doing breathing exercises.  Take deep breaths and cough every 1-2 hours. Your doctor may order a device called an Incentive Spirometer to help you take deep breaths. When coughing or sneezing, hold a pillow firmly against your incision with both hands. This is called "splinting." Doing this helps protect your incision. It also decreases belly discomfort.  If you are being admitted to the hospital overnight, leave your suitcase in the car. After surgery it may be brought to your room.  If you are being discharged the day of surgery, you will not be allowed to drive home. You will need a responsible adult (18 years or older) to drive you home and stay with you that night.   If you are taking public transportation, you will need to have a responsible adult (18 years or older)  with you. Please confirm with your physician that it is acceptable to use public transportation.   Please call the Pre-admissions Testing Dept. at [phone] if you have any questions about these instructions.  Surgery Visitation Policy:  Patients undergoing a surgery or procedure may have two family members or support persons with them as long as the person is not COVID-19 positive or experiencing its symptoms.

## 2021-10-26 MED ORDER — CHLORHEXIDINE GLUCONATE 0.12 % MT SOLN: 15.0000 mL | Freq: Once | OROMUCOSAL | Status: AC

## 2021-10-26 MED ORDER — ORAL CARE MOUTH RINSE: 15.0000 mL | Freq: Once | OROMUCOSAL | Status: AC

## 2021-10-26 MED ORDER — CEFAZOLIN SODIUM-DEXTROSE 2-4 GM/100ML-% IV SOLN
2.0000 g | INTRAVENOUS | Status: AC
  Administered 2021-10-27: 2 g via INTRAVENOUS
  Administered 2021-10-27: 1 g via INTRAVENOUS

## 2021-10-26 MED ORDER — SODIUM CHLORIDE 0.9 % IV SOLN: INTRAVENOUS | Status: DC

## 2021-10-27 ENCOUNTER — Ambulatory Visit: Payer: BC Managed Care – PPO | Admitting: Anesthesiology

## 2021-10-27 ENCOUNTER — Other Ambulatory Visit: Payer: Self-pay | Admitting: Urology

## 2021-10-27 ENCOUNTER — Other Ambulatory Visit: Payer: Self-pay

## 2021-10-27 ENCOUNTER — Telehealth: Payer: Self-pay

## 2021-10-27 ENCOUNTER — Encounter: Payer: Self-pay | Admitting: Urology

## 2021-10-27 ENCOUNTER — Encounter
Admission: RE | Disposition: A | Discharge status: Home / Self Care | Payer: Self-pay | Source: Ambulatory Visit | Attending: Urology

## 2021-10-27 ENCOUNTER — Ambulatory Visit
Admission: RE | Admit: 2021-10-27 | Discharge: 2021-10-27 | Disposition: A | Discharge status: Home / Self Care | Payer: BC Managed Care – PPO | Source: Ambulatory Visit | Attending: Urology | Admitting: Urology

## 2021-10-27 DIAGNOSIS — N9989 Other postprocedural complications and disorders of genitourinary system: Secondary | ICD-10-CM | POA: Diagnosis not present

## 2021-10-27 DIAGNOSIS — Z01818 Encounter for other preprocedural examination: Secondary | ICD-10-CM

## 2021-10-27 DIAGNOSIS — Z302 Encounter for sterilization: Secondary | ICD-10-CM | POA: Diagnosis present

## 2021-10-27 HISTORY — PX: VASECTOMY: SHX75

## 2021-10-27 LAB — GLUCOSE, CAPILLARY
Glucose-Capillary: 267 mg/dL — ABNORMAL HIGH (ref 70–99)
Glucose-Capillary: 269 mg/dL — ABNORMAL HIGH (ref 70–99)
Glucose-Capillary: 257 mg/dL — ABNORMAL HIGH (ref 70–99)

## 2021-10-27 SURGERY — VASECTOMY
Anesthesia: General | Laterality: Right

## 2021-10-27 MED ORDER — OXYCODONE-ACETAMINOPHEN 5-325 MG PO TABS: 1.0000 | ORAL_TABLET | ORAL | 0 refills | Status: DC | PRN

## 2021-10-27 MED ORDER — FENTANYL CITRATE (PF) 100 MCG/2ML IJ SOLN
INTRAMUSCULAR | Status: AC
  Administered 2021-10-27: 50 ug via INTRAVENOUS
  Filled 2021-10-27: qty 2

## 2021-10-27 MED ORDER — CHLORHEXIDINE GLUCONATE 0.12 % MT SOLN
OROMUCOSAL | Status: AC
  Administered 2021-10-27: 15 mL via OROMUCOSAL
  Filled 2021-10-27: qty 15

## 2021-10-27 MED ORDER — ROCURONIUM BROMIDE 100 MG/10ML IV SOLN
INTRAVENOUS | Status: DC | PRN
  Administered 2021-10-27: 10 mg via INTRAVENOUS

## 2021-10-27 MED ORDER — OXYCODONE HCL 5 MG/5ML PO SOLN: 5.0000 mg | Freq: Once | ORAL | Status: AC | PRN

## 2021-10-27 MED ORDER — MIDAZOLAM HCL 2 MG/2ML IJ SOLN
INTRAMUSCULAR | Status: AC
  Filled 2021-10-27: qty 2

## 2021-10-27 MED ORDER — ONDANSETRON HCL 4 MG/2ML IJ SOLN
INTRAMUSCULAR | Status: DC | PRN
  Administered 2021-10-27: 4 mg via INTRAVENOUS

## 2021-10-27 MED ORDER — SUCCINYLCHOLINE CHLORIDE 200 MG/10ML IV SOSY
PREFILLED_SYRINGE | INTRAVENOUS | Status: DC | PRN
  Administered 2021-10-27: 160 mg via INTRAVENOUS

## 2021-10-27 MED ORDER — FENTANYL CITRATE (PF) 100 MCG/2ML IJ SOLN
25.0000 ug | INTRAMUSCULAR | Status: DC | PRN
  Administered 2021-10-27: 50 ug via INTRAVENOUS

## 2021-10-27 MED ORDER — LIDOCAINE HCL (PF) 1 % IJ SOLN
INTRAMUSCULAR | Status: AC
  Filled 2021-10-27: qty 30

## 2021-10-27 MED ORDER — 0.9 % SODIUM CHLORIDE (POUR BTL) OPTIME
TOPICAL | Status: DC | PRN
  Administered 2021-10-27: 10 mL

## 2021-10-27 MED ORDER — INSULIN ASPART 100 UNIT/ML IJ SOLN
INTRAMUSCULAR | Status: AC
  Filled 2021-10-27: qty 1

## 2021-10-27 MED ORDER — LIDOCAINE HCL (PF) 1 % IJ SOLN
INTRAMUSCULAR | Status: DC | PRN
  Administered 2021-10-27: 3 mL
  Administered 2021-10-27: 7 mL

## 2021-10-27 MED ORDER — FENTANYL CITRATE (PF) 100 MCG/2ML IJ SOLN
INTRAMUSCULAR | Status: DC | PRN
  Administered 2021-10-27 (×2): 50 ug via INTRAVENOUS

## 2021-10-27 MED ORDER — LIDOCAINE HCL (CARDIAC) PF 100 MG/5ML IV SOSY
PREFILLED_SYRINGE | INTRAVENOUS | Status: DC | PRN
  Administered 2021-10-27: 100 mg via INTRAVENOUS

## 2021-10-27 MED ORDER — SUGAMMADEX SODIUM 500 MG/5ML IV SOLN
INTRAVENOUS | Status: DC | PRN
  Administered 2021-10-27: 20 mg via INTRAVENOUS

## 2021-10-27 MED ORDER — DEXMEDETOMIDINE (PRECEDEX) IN NS 20 MCG/5ML (4 MCG/ML) IV SYRINGE
PREFILLED_SYRINGE | INTRAVENOUS | Status: DC | PRN
  Administered 2021-10-27: 4 ug via INTRAVENOUS

## 2021-10-27 MED ORDER — KETOROLAC TROMETHAMINE 30 MG/ML IJ SOLN
INTRAMUSCULAR | Status: DC | PRN
  Administered 2021-10-27: 30 mg via INTRAVENOUS

## 2021-10-27 MED ORDER — OXYCODONE HCL 5 MG PO TABS: 5.0000 mg | ORAL_TABLET | Freq: Once | ORAL | Status: AC | PRN

## 2021-10-27 MED ORDER — ACETAMINOPHEN 10 MG/ML IV SOLN
INTRAVENOUS | Status: AC
  Filled 2021-10-27: qty 100

## 2021-10-27 MED ORDER — MIDAZOLAM HCL 2 MG/2ML IJ SOLN
INTRAMUSCULAR | Status: DC | PRN
  Administered 2021-10-27: 2 mg via INTRAVENOUS

## 2021-10-27 MED ORDER — INSULIN ASPART 100 UNIT/ML IJ SOLN
INTRAMUSCULAR | Status: AC
  Administered 2021-10-27: 5 [IU] via SUBCUTANEOUS
  Filled 2021-10-27: qty 1

## 2021-10-27 MED ORDER — ACETAMINOPHEN 10 MG/ML IV SOLN
INTRAVENOUS | Status: DC | PRN
  Administered 2021-10-27: 1000 mg via INTRAVENOUS

## 2021-10-27 MED ORDER — OXYCODONE HCL 5 MG PO TABS
ORAL_TABLET | ORAL | Status: AC
  Administered 2021-10-27: 5 mg via ORAL
  Filled 2021-10-27: qty 1

## 2021-10-27 MED ORDER — INSULIN ASPART 100 UNIT/ML IJ SOLN
5.0000 [IU] | Freq: Once | INTRAMUSCULAR | Status: AC
  Administered 2021-10-27: 5 [IU] via SUBCUTANEOUS

## 2021-10-27 MED ORDER — DEXAMETHASONE SODIUM PHOSPHATE 10 MG/ML IJ SOLN
INTRAMUSCULAR | Status: DC | PRN
  Administered 2021-10-27: 5 mg via INTRAVENOUS

## 2021-10-27 MED ORDER — FENTANYL CITRATE (PF) 100 MCG/2ML IJ SOLN
INTRAMUSCULAR | Status: AC
  Filled 2021-10-27: qty 2

## 2021-10-27 MED ORDER — GLYCOPYRROLATE 0.2 MG/ML IJ SOLN
INTRAMUSCULAR | Status: DC | PRN
  Administered 2021-10-27: .2 mg via INTRAVENOUS

## 2021-10-27 MED ORDER — INSULIN ASPART 100 UNIT/ML IJ SOLN
5.0000 [IU] | Freq: Once | INTRAMUSCULAR | Status: AC
Start: 2021-10-27 — End: 2021-10-27

## 2021-10-27 MED ORDER — ESMOLOL HCL 100 MG/10ML IV SOLN
INTRAVENOUS | Status: DC | PRN
  Administered 2021-10-27: 10 mg via INTRAVENOUS

## 2021-10-27 MED ORDER — CEFAZOLIN SODIUM-DEXTROSE 2-4 GM/100ML-% IV SOLN
INTRAVENOUS | Status: AC
  Filled 2021-10-27: qty 100

## 2021-10-27 SURGICAL SUPPLY — 37 items
ADH SKN CLS APL DERMABOND .7 (GAUZE/BANDAGES/DRESSINGS) ×1
APL PRP STRL LF DISP 70% ISPRP (MISCELLANEOUS) ×1
BLADE CLIPPER SURG (BLADE) ×2 IMPLANT
BLADE SURG 15 STRL LF DISP TIS (BLADE) ×1 IMPLANT
BLADE SURG 15 STRL SS (BLADE) ×2
CHLORAPREP W/TINT 26 (MISCELLANEOUS) ×2 IMPLANT
DERMABOND ADVANCED (GAUZE/BANDAGES/DRESSINGS) ×1
DERMABOND ADVANCED .7 DNX12 (GAUZE/BANDAGES/DRESSINGS) ×1 IMPLANT
DRAIN PENROSE 12X.25 LTX STRL (MISCELLANEOUS) ×1 IMPLANT
DRAPE LAPAROTOMY 77X122 PED (DRAPES) ×2 IMPLANT
DRSG GAUZE FLUFF 36X18 (GAUZE/BANDAGES/DRESSINGS) ×1 IMPLANT
ELECT CAUTERY NEEDLE TIP 1.0 (MISCELLANEOUS) ×2
ELECT REM PT RETURN 9FT ADLT (ELECTROSURGICAL) ×2
ELECTRODE CAUTERY NEDL TIP 1.0 (MISCELLANEOUS) ×1 IMPLANT
ELECTRODE REM PT RTRN 9FT ADLT (ELECTROSURGICAL) ×1 IMPLANT
GLOVE BIO SURGEON STRL SZ 6.5 (GLOVE) ×4 IMPLANT
GLOVE BIOGEL PI IND STRL 7.0 (GLOVE) IMPLANT
GLOVE BIOGEL PI INDICATOR 7.0 (GLOVE) ×1
GOWN STRL REUS W/ TWL LRG LVL3 (GOWN DISPOSABLE) ×2 IMPLANT
GOWN STRL REUS W/TWL LRG LVL3 (GOWN DISPOSABLE) ×4
KIT TURNOVER KIT A (KITS) ×2 IMPLANT
LABEL OR SOLS (LABEL) ×2 IMPLANT
MANIFOLD NEPTUNE II (INSTRUMENTS) ×2 IMPLANT
NDL HYPO 25X1 1.5 SAFETY (NEEDLE) ×1 IMPLANT
NEEDLE HYPO 25X1 1.5 SAFETY (NEEDLE) ×2 IMPLANT
NS IRRIG 500ML POUR BTL (IV SOLUTION) ×2 IMPLANT
PACK BASIN MINOR ARMC (MISCELLANEOUS) ×2 IMPLANT
SUCTION FRAZIER HANDLE 10FR (MISCELLANEOUS)
SUCTION TUBE FRAZIER 10FR DISP (MISCELLANEOUS) IMPLANT
SUPPORT SCROTAL LG STRP (MISCELLANEOUS) ×2 IMPLANT
SUT CHROMIC 3 0 PS 2 (SUTURE) IMPLANT
SUT CHROMIC 4 0 RB 1X27 (SUTURE) ×2 IMPLANT
SUT ETHILON NAB PS2 4-0 18IN (SUTURE) ×1 IMPLANT
SUT VIC AB 3-0 SH 27 (SUTURE) ×2
SUT VIC AB 3-0 SH 27X BRD (SUTURE) IMPLANT
SYR 10ML LL (SYRINGE) ×2 IMPLANT
WATER STERILE IRR 500ML POUR (IV SOLUTION) ×2 IMPLANT

## 2021-10-27 NOTE — Telephone Encounter (Signed)
Prescription for pain medication from surgery today did not transmit to pharmacy. Can you resend this? Patient advised of follow up appt for this upcoming Thursday.

## 2021-10-27 NOTE — Transfer of Care (Signed)
Immediate Anesthesia Transfer of Care Note  Patient: Montrice Callander  Procedure(s) Performed: VASECTOMY (Right)  Patient Location: PACU  Anesthesia Type:General  Level of Consciousness: awake, alert  and oriented  Airway & Oxygen Therapy: Patient Spontanous Breathing  Post-op Assessment: Report given to RN and Post -op Vital signs reviewed and stable  Post vital signs: Reviewed and stable  Last Vitals:  Vitals Value Taken Time  BP 143/108 10/27/21 1203  Temp    Pulse 106 10/27/21 1205  Resp 14 10/27/21 1205  SpO2 99 % 10/27/21 1205  Vitals shown include unvalidated device data.  Last Pain:  Vitals:   10/27/21 0940  TempSrc: Oral  PainSc: 3          Complications: No notable events documented.

## 2021-10-27 NOTE — Discharge Instructions (Addendum)
Continue using scrotal support.  You have a drain in the bottom part of your scrotum.  It will likely leak clear or bloody fluid.  Use gauze to keep from soiling your garments.  Change the gauze as needed.  You may shower but no bath bathing.   AMBULATORY SURGERY  DISCHARGE INSTRUCTIONS   The drugs that you were given will stay in your system until tomorrow so for the next 24 hours you should not:  Drive an automobile Make any legal decisions Drink any alcoholic beverage   You may resume regular meals tomorrow.  Today it is better to start with liquids and gradually work up to solid foods.  You may eat anything you prefer, but it is better to start with liquids, then soup and crackers, and gradually work up to solid foods.   Please notify your doctor immediately if you have any unusual bleeding, trouble breathing, redness and pain at the surgery site, drainage, fever, or pain not relieved by medication.    Additional Instructions:        Please contact your physician with any problems or Same Day Surgery at [phone], Monday through Friday 6 am to 4 pm, or [place] [facility] number at [phone].

## 2021-10-27 NOTE — Anesthesia Procedure Notes (Signed)
Procedure Name: Intubation Date/Time: 10/27/2021 11:14 AM  Performed by: Mohammed Kindle, CRNAPre-anesthesia Checklist: Patient identified, Emergency Drugs available, Suction available and Patient being monitored Patient Re-evaluated:Patient Re-evaluated prior to induction Oxygen Delivery Method: Circle system utilized Preoxygenation: Pre-oxygenation with 100% oxygen Induction Type: IV induction Ventilation: Mask ventilation without difficulty Laryngoscope Size: McGraph and 3 Tube type: Oral Tube size: 7.0 mm Number of attempts: 1 Airway Equipment and Method: Stylet and Oral airway Placement Confirmation: ETT inserted through vocal cords under direct vision, positive ETCO2, breath sounds checked- equal and bilateral and CO2 detector Secured at: 21 cm Tube secured with: Tape Dental Injury: Teeth and Oropharynx as per pre-operative assessment

## 2021-10-27 NOTE — Op Note (Signed)
Date of procedure: 10/27/21  Preoperative diagnosis:  Desires infertility    Postoperative diagnosis:  Same as above  Procedure: Right vasectomy  Surgeon: Vanna Scotland, MD  Anesthesia: General  Complications: None  Intraoperative findings: Extremely thickened right cord requiring delivery of the right testicle into the field but ultimately able to achieve right-sided vasectomy.  Drain placed due to concern for hematoma/seroma.  EBL: Minimal  Specimens: Right vas  Drains: Quarter-inch Penrose drain  Indication: Scott Mayo is a 31 y.o. patient with very difficult scrotal anatomy status post office vasectomy where the left side was ligated however unable to definitively identify the right vas..  After reviewing the management options for treatment, he elected to proceed with the above surgical procedure(s). We have discussed the potential benefits and risks of the procedure, side effects of the proposed treatment, the likelihood of the patient achieving the goals of the procedure, and any potential problems that might occur during the procedure or recuperation. Informed consent has been obtained.  Description of procedure:  The patient was taken to the operating room and general anesthesia was induced.  The patient was placed in the supine position, prepped and draped in the usual sterile fashion, and preoperative antibiotics were administered. A preoperative time-out was performed.   At the beginning of the procedure, I tried to palpate the right vas unsuccessfully.  The cord and scrotal contents was quite thickened, indurated, and almost matted.  I was not able to palpate the vas deferens.  Ended up starting with a 2 cm longitudinal incision over the right hemiscrotum.  Dartos was opened using Bugbee electrocautery.  I used blunt dissection in order to isolate the cord and cord structures.  I then used an Allis to try deliver the cord structures to the incision and just inferior  to this, did palpate what I felt was the vas.  I used a ring clamp to grasp the structure and dissected out.  I transected it however there was not a clear lumen and it was not 100% sure that this was the vas.  At this point in time, I elected for more aggressive open type procedure.  I extended the incision to 3 cm and ended up delivering the testicle through the incision but within the tunica vaginalis which was not opened.  The structures closer to the testicle were much more anatomically clear and I was able to both palpate and visualize the convoluted vas.  I grasped it through the tunica using the ring clamp.  I used the sharp dissectors then dissect out the structure.  Either side of the dissected structure was then isolated using penetrating towel clips.  I transected the Vas of which the lumen could be clearly seen.  In the proximal end, I used pinpoint electrocautery within the lumen of the Vas to obliterate the vas.  I then used an 11 blade scalpel to take a small section of the Vas to be sent for pathologic analysis to ensure that the procedure was appropriate and completed.  The testicular end of the Vas lumen was then fulgurated using the same technique.  Careful hemostasis was then achieved.  Despite this, especially the dark toes, was relatively oozy.  I made a stab incision in the inferior portion of the scrotum and snake to quarter inch Penrose drain which was secured using a nylon.  I then ended up closing dartos in a running fashion using a 3-0 Vicryl suture.  The skin was reapproximated using 4-0 chromic interrupted sutures.  Patient was cleaned and dried and additional Dermabond scrotal fluffs and a scrotal support device were applied.  He was then repositioned in the supine position, reversed of anesthesia, and taken to the PACU in stable condition.  Plan: We will remove his Penrose drain on Thursday.  Plan for semen analysis in about 3 months.   Vanna Scotland, M.D.

## 2021-10-27 NOTE — Anesthesia Postprocedure Evaluation (Signed)
Anesthesia Post Note  Patient: Scott Mayo  Procedure(s) Performed: VASECTOMY (Right)  Patient location during evaluation: PACU Anesthesia Type: General Level of consciousness: awake and alert Pain management: pain level controlled Vital Signs Assessment: post-procedure vital signs reviewed and stable Respiratory status: spontaneous breathing, nonlabored ventilation, respiratory function stable and patient connected to nasal cannula oxygen Cardiovascular status: blood pressure returned to baseline and stable Postop Assessment: no apparent nausea or vomiting Anesthetic complications: no   No notable events documented.   Last Vitals:  Vitals:   10/27/21 1210 10/27/21 1215  BP:  133/85  Pulse: (!) 110 (!) 103  Resp: 16 17  Temp:    SpO2: 100% 96%    Last Pain:  Vitals:   10/27/21 1210  TempSrc:   PainSc: 6                  Stephanie Coup

## 2021-10-27 NOTE — Progress Notes (Signed)
10/27/21  CC: No chief complaint on file.   HPI:  There were no vitals taken for this visit. NED. A&Ox3.   No respiratory distress   Abd soft, NT, ND Normal external genitalia with patent urethral meatus  A timeout was performed.  Patient's identity and consent was confirmed.  All questions were answered.   Bilateral Vasectomy Procedure  Pre-Procedure: - Patient's scrotum was prepped and draped for vasectomy. - The vas was palpated through the scrotal skin on the left. - 1% Xylocaine was injected into the skin and surrounding tissue for placement  - In a similar manner, the vas on the right was identified, anesthetized, and stabilized.  Procedure: - A #11 blade was used to make a small stab incision in the skin overlying the vas - The left vas was isolated and brought up through the incision exposing that structure. - Bleeding points were cauterized as they occurred. - The vas was free from the surrounding structures and brought to the view. - A segment was positioned for placement with a hemostat. - A second hemostat was placed and a small segment between the two hemostats and was removed for inspection. - Each end of the transected vas lumen was fulgurated/ obliterated using needlepoint electrocautery -A fascial interposition was performed on testicular end of the vas using #3-0 chromic suture -The same procedure was performed on the right. - A single suture of #3-0 chromic catgut was used to close each lateral scrotal skin incision - A dressing was applied.  Post-Procedure: - Patient was instructed in care of the operative area - A specimen is to be delivered in 12 weeks   -Another form of contraception is to be used until post vasectomy semen analysis  Vanna Scotland, MD

## 2021-10-27 NOTE — Progress Notes (Signed)
Notified Dr. Lorette Ang, anesthesia, that patient's FSBS was 267.  He acknowledged and gave me a verbal order for 5 units of Novolog.  I acknowledged and placed the order.

## 2021-10-27 NOTE — Telephone Encounter (Signed)
This patient called back in. Apparently the pharmacy's system is down. He is asking if you can send in the Rx from the surgery this morning to [facility] on [address]?

## 2021-10-27 NOTE — H&P (Signed)
10/27/21 RRR CTAB   CC:     Chief Complaint  Patient presents with   VAS      HPI: Scott Mayo is a 31 y.o. male who presents today for a cystoscopy.    He denies a history of testicular pain.  No urinary issues.  No previous scrotal surgeries.   He reports that he had an accident when he was in highschool playing lacrosse and he had impact to his testicles.    He has 2 children.       Vitals:    08/28/21 1110  BP: (!) 134/93  Pulse: 69    NED. A&Ox3.   No respiratory distress   Abd soft, NT, ND Normal external genitalia with patent urethral meatus   Timeout was performed.  Consent was confirmed.     Bilateral Vasectomy Procedure   Pre-Procedure: - Patient's scrotum was prepped and draped for vasectomy. - The vas was palpated through the scrotal skin on the left. - 1% Xylocaine was injected into the skin and surrounding tissue for placement  - In a similar manner, the vas on the right was identified, anesthetized, and stabilized.   Procedure: - A #11 blade was used to make a small stab incision in the skin overlying the vas - The left vas was isolated and brought up through the incision exposing that structure. - Bleeding points were cauterized as they occurred. - The vas was free from the surrounding structures and brought to the view. - A segment was positioned for placement with a hemostat. - A second hemostat was placed and a small segment between the two hemostats and was removed for inspection. - Each end of the transected vas lumen was fulgurated/ obliterated using needlepoint electrocautery -A fascial interposition was performed on testicular end of the vas using #3-0 chromic suture -The same procedure was performed on the right. - A single suture of #3-0 chromic catgut was used to close each lateral scrotal skin incision - A dressing was applied. - On the right side, struggled to palpate and isolate vas despite multiple manuevers only successful on  the left side. We discussed proceeding to OR for  right sided vasectomy under anesthetis versus more dissective approach. He is agreeable with this plan.      I have reviewed the above documentation for accuracy and completeness, and I agree with the above.    Vanna Scotland, MD

## 2021-10-27 NOTE — Anesthesia Preprocedure Evaluation (Signed)
Anesthesia Evaluation  Patient identified by MRN, date of birth, ID band Patient awake    Reviewed: Allergy & Precautions, NPO status , Patient's Chart, lab work & pertinent test results  Airway Mallampati: III  TM Distance: >3 FB Neck ROM: full    Dental  (+) Chipped   Pulmonary sleep apnea ,    Pulmonary exam normal        Cardiovascular negative cardio ROS Normal cardiovascular exam     Neuro/Psych Depression negative neurological ROS  negative psych ROS   GI/Hepatic Neg liver ROS, GERD  ,  Endo/Other  diabetes  Renal/GU negative Renal ROS  negative genitourinary   Musculoskeletal   Abdominal   Peds  Hematology negative hematology ROS (+)   Anesthesia Other Findings Past Medical History: No date: Anemia     Comment:  as a baby No date: DDD (degenerative disc disease), lumbar No date: Diabetes mellitus without complication (HCC) No date: GERD (gastroesophageal reflux disease)     Comment:  occ No date: Headache     Comment:  migraines No date: Obese No date: Sleep apnea     Comment:  not currently using cpap  Past Surgical History: No date: NO PAST SURGERIES No date: VASECTOMY     Comment:  done in office  BMI    Body Mass Index: 39.46 kg/m      Reproductive/Obstetrics negative OB ROS                             Anesthesia Physical Anesthesia Plan  ASA: 2  Anesthesia Plan: General   Post-op Pain Management:    Induction:   PONV Risk Score and Plan: Propofol infusion and TIVA  Airway Management Planned:   Additional Equipment:   Intra-op Plan:   Post-operative Plan:   Informed Consent: I have reviewed the patients History and Physical, chart, labs and discussed the procedure including the risks, benefits and alternatives for the proposed anesthesia with the patient or authorized representative who has indicated his/her understanding and acceptance.      Dental Advisory Given  Plan Discussed with: Anesthesiologist, CRNA and Surgeon  Anesthesia Plan Comments:         Anesthesia Quick Evaluation

## 2021-10-28 ENCOUNTER — Encounter: Payer: Self-pay | Admitting: Urology

## 2021-10-28 ENCOUNTER — Other Ambulatory Visit: Payer: Self-pay | Admitting: Urology

## 2021-10-28 LAB — SURGICAL PATHOLOGY

## 2021-10-28 MED ORDER — OXYCODONE-ACETAMINOPHEN 5-325 MG PO TABS: 1.0000 | ORAL_TABLET | ORAL | 0 refills | Status: DC | PRN

## 2021-10-30 ENCOUNTER — Ambulatory Visit: Payer: BC Managed Care – PPO | Admitting: Physician Assistant

## 2021-10-30 VITALS — BP 120/80 | HR 88 | Ht 69.0 in | Wt 273.0 lb

## 2021-10-30 DIAGNOSIS — Z4803 Encounter for change or removal of drains: Secondary | ICD-10-CM

## 2021-10-30 NOTE — Progress Notes (Signed)
Patient presented to clinic today for drain removal after undergoing right vasectomy with Dr. Apolinar Junes in the OR 3 days ago.  She had previously attempted vasectomy in clinic, however was unable to access the right vas due to extremely thickened right spermatic cord.  Today he reports he is doing well.  There has been some serosanguineous drainage from his drain.  Using sterile forceps and scissors, I removed the securing stitch in its entirety and subsequently pulled the Penrose drain from the inferior right scrotum in its entirety.  Patient tolerated well.  I covered the drain site with sterile 4 x 4 gauze and counseled the patient to keep it protected and avoid water entering the site for 2 to 3 days to allow for deeper healing.  Scrotal exam with expected edema following procedure, no evidence of seroma or hematoma today.  We again reviewed the need to undergo semen analysis in 3 months and that his vasectomy is not considered definitive birth control until this is performed.  He expressed understanding.  Carman Ching, PA-C 10/30/21 5:52 PM  I spent 15 minutes on the day of the encounter to include pre-visit record review, face-to-face time with the patient, and post-visit ordering of tests.

## 2021-12-01 ENCOUNTER — Other Ambulatory Visit: Payer: BC Managed Care – PPO

## 2022-01-29 ENCOUNTER — Other Ambulatory Visit: Payer: BC Managed Care – PPO

## 2022-01-29 DIAGNOSIS — Z3009 Encounter for other general counseling and advice on contraception: Secondary | ICD-10-CM

## 2022-01-30 LAB — POST-VAS SPERM EVALUATION,QUAL: Volume: 0.5 mL

## 2022-02-14 ENCOUNTER — Ambulatory Visit
Admission: RE | Admit: 2022-02-14 | Discharge: 2022-02-14 | Disposition: A | Discharge status: Home / Self Care | Payer: BC Managed Care – PPO | Source: Ambulatory Visit | Attending: Physician Assistant | Admitting: Physician Assistant

## 2022-02-14 VITALS — BP 137/89 | HR 89 | Temp 98.8°F | Resp 16

## 2022-02-14 DIAGNOSIS — J209 Acute bronchitis, unspecified: Secondary | ICD-10-CM

## 2022-02-14 MED ORDER — PREDNISONE 20 MG PO TABS: 40.0000 mg | ORAL_TABLET | Freq: Every day | ORAL | 0 refills | Status: AC

## 2022-02-14 MED ORDER — PREDNISONE 20 MG PO TABS: 40.0000 mg | ORAL_TABLET | Freq: Every day | ORAL | 0 refills | Status: DC

## 2022-02-14 NOTE — ED Provider Notes (Signed)
[facility]    CSN: [id number] Arrival date & time: 02/14/22  1048      History   Chief Complaint Chief Complaint  Patient presents with   Cough    Persistent coughing and congestion. With itchy thoat since 10/16 - Entered by patient   Nasal Congestion    HPI Scott Mayo is a 31 y.o. male.   Here today for evaluation of coughing, nasal congestion and itchy throat he has had for 2 weeks.  He denies any fever.  He has not had any body aches or fatigue.  He reports 3 negative COVID test at home.  He has not had any nausea, vomiting or diarrhea.  He has tried Robitussin with mild relief.  The history is provided by the patient.  Cough Associated symptoms: no chills, no ear pain, no eye discharge, no fever, no shortness of breath and no sore throat     Past Medical History:  Diagnosis Date   Anemia    as a baby   DDD (degenerative disc disease), lumbar    Diabetes mellitus without complication ([place])    GERD (gastroesophageal reflux disease)    occ   Headache    migraines   Obese    Sleep apnea    not currently using cpap    Patient Active Problem List   Diagnosis Date Noted   OSA (obstructive sleep apnea) 03/04/2017   Major depression, melancholic type 26/71/2458   Type 2 diabetes mellitus without complication, without long-term current use of insulin ([place]) 01/30/2016   BMI 45.0-49.9, adult ([place]) 01/29/2016   Degenerative disc disease at L5-S1 level 01/29/2016   Primary snoring 01/29/2016   Thoracic or lumbosacral neuritis or radiculitis, unspecified 10/02/2013    Past Surgical History:  Procedure Laterality Date   NO PAST SURGERIES     VASECTOMY     done in office   VASECTOMY Right 10/27/2021   Procedure: VASECTOMY;  Surgeon: Hollice Espy, MD;  Location: [facility] ORS;  Service: Urology;  Laterality: Right;       Home Medications    Prior to Admission medications   Medication Sig Start Date End Date Taking? Authorizing Provider   predniSONE (DELTASONE) 20 MG tablet Take 2 tablets (40 mg total) by mouth daily with breakfast for 5 days. 02/14/22 02/19/22 Yes Francene Finders, PA-C  JARDIANCE 25 MG TABS tablet Take 25 mg by mouth every morning. 07/05/21   [provider]  Lancets (FREESTYLE) lancets CHECK FASTING BLOOD SUGAR D 02/03/16   [provider]  oxyCODONE-acetaminophen (PERCOCET) 5-325 MG tablet Take 1-2 tablets by mouth every 4 (four) hours as needed for moderate pain or severe pain. 10/28/21   Hollice Espy, MD  traMADol (ULTRAM) 50 MG tablet Take 1 tablet (50 mg total) by mouth every 6 (six) hours as needed. 08/29/21   Hollice Espy, MD  VICTOZA 18 MG/3ML SOPN Inject 1.2 mg into the skin every morning. 06/05/21   [provider]    Family History Family History  Problem Relation Age of Onset   Prostate cancer Neg Hx    Bladder Cancer Neg Hx    Kidney cancer Neg Hx     Social History Social History   Tobacco Use   Smoking status: Never   Smokeless tobacco: Never  Substance Use Topics   Alcohol use: Yes    Comment: occ   Drug use: Not Currently    Types: Marijuana    Comment: only in high school  Allergies   Patient has no known allergies.   Review of Systems Review of Systems  Constitutional:  Negative for chills and fever.  HENT:  Positive for congestion. Negative for ear pain and sore throat.   Eyes:  Negative for discharge and redness.  Respiratory:  Positive for cough. Negative for shortness of breath.   Gastrointestinal:  Negative for abdominal pain, diarrhea, nausea and vomiting.     Physical Exam Triage Vital Signs ED Triage Vitals  Enc Vitals Group     BP 02/14/22 1113 137/89     Pulse Rate 02/14/22 1113 89     Resp 02/14/22 1113 16     Temp 02/14/22 1113 98.8 F (37.1 C)     Temp Source 02/14/22 1113 Oral     SpO2 02/14/22 1113 96 %     Weight --      Height --      Head Circumference --      Peak Flow --      Pain Score 02/14/22 1114  2     Pain Loc --      Pain Edu? --      Excl. in GC? --    No data found.  Updated Vital Signs BP 137/89 (BP Location: Left Arm)   Pulse 89   Temp 98.8 F (37.1 C) (Oral)   Resp 16   SpO2 96%      Physical Exam Vitals and nursing note reviewed.  Constitutional:      General: He is not in acute distress.    Appearance: Normal appearance. He is not ill-appearing.  HENT:     Head: Normocephalic and atraumatic.     Nose: Congestion (mild) present.     Mouth/Throat:     Mouth: Mucous membranes are moist.     Pharynx: Oropharynx is clear. No oropharyngeal exudate or posterior oropharyngeal erythema.  Eyes:     Conjunctiva/sclera: Conjunctivae normal.  Cardiovascular:     Rate and Rhythm: Normal rate and regular rhythm.     Heart sounds: Normal heart sounds. No murmur heard. Pulmonary:     Effort: Pulmonary effort is normal. No respiratory distress.     Breath sounds: Normal breath sounds. No wheezing, rhonchi or rales.  Skin:    General: Skin is warm and dry.  Neurological:     Mental Status: He is alert.  Psychiatric:        Mood and Affect: Mood normal.        Thought Content: Thought content normal.      UC Treatments / Results  Labs (all labs ordered are listed, but only abnormal results are displayed) Labs Reviewed - No data to display  EKG   Radiology No results found.  Procedures Procedures (including critical care time)  Medications Ordered in UC Medications - No data to display  Initial Impression / Assessment and Plan / UC Course  I have reviewed the triage vital signs and the nursing notes.  Pertinent labs & imaging results that were available during my care of the patient were reviewed by me and considered in my medical decision making (see chart for details).    Suspect bronchitis, recommended treatment with steroid burst.  Encouraged follow-up if no gradual improvement or with any further concerns.  Final Clinical Impressions(s) / UC  Diagnoses   Final diagnoses:  Acute bronchitis, unspecified organism   Discharge Instructions   None    ED Prescriptions     Medication Sig Dispense Auth. Provider  predniSONE (DELTASONE) 20 MG tablet  (Status: Discontinued) Take 2 tablets (40 mg total) by mouth daily with breakfast for 5 days. 10 tablet Erma Pinto F, PA-C   predniSONE (DELTASONE) 20 MG tablet Take 2 tablets (40 mg total) by mouth daily with breakfast for 5 days. 10 tablet Tomi Bamberger, PA-C      PDMP not reviewed this encounter.   Tomi Bamberger, PA-C 02/14/22 1151

## 2022-02-14 NOTE — ED Triage Notes (Signed)
Pt said x 2 weeks has been off and on coughing, nasal drainage. Itchy throat. Coughing up clear and blowing some drainage. with 3 negative covid test. No fevers, no chills.

## 2022-07-31 ENCOUNTER — Encounter: Payer: Self-pay | Admitting: Emergency Medicine

## 2022-07-31 ENCOUNTER — Ambulatory Visit
Admission: EM | Admit: 2022-07-31 | Discharge: 2022-07-31 | Disposition: A | Discharge status: Home / Self Care | Payer: No Typology Code available for payment source | Attending: Emergency Medicine | Admitting: Emergency Medicine

## 2022-07-31 ENCOUNTER — Ambulatory Visit (INDEPENDENT_AMBULATORY_CARE_PROVIDER_SITE_OTHER): Payer: No Typology Code available for payment source

## 2022-07-31 DIAGNOSIS — S91112A Laceration without foreign body of left great toe without damage to nail, initial encounter: Secondary | ICD-10-CM

## 2022-07-31 DIAGNOSIS — Z23 Encounter for immunization: Secondary | ICD-10-CM | POA: Diagnosis not present

## 2022-07-31 DIAGNOSIS — S97102A Crushing injury of unspecified left toe(s), initial encounter: Secondary | ICD-10-CM

## 2022-07-31 DIAGNOSIS — T148XXA Other injury of unspecified body region, initial encounter: Secondary | ICD-10-CM

## 2022-07-31 MED ORDER — SULFAMETHOXAZOLE-TRIMETHOPRIM 800-160 MG PO TABS: 1.0000 | ORAL_TABLET | Freq: Two times a day (BID) | ORAL | 0 refills | Status: AC

## 2022-07-31 MED ORDER — TETANUS-DIPHTH-ACELL PERTUSSIS 5-2.5-18.5 LF-MCG/0.5 IM SUSY
0.5000 mL | PREFILLED_SYRINGE | Freq: Once | INTRAMUSCULAR | Status: AC
  Administered 2022-07-31: 0.5 mL via INTRAMUSCULAR

## 2022-07-31 NOTE — ED Triage Notes (Signed)
Pt presents with a laceration on the rt second little toe. Injury happened approx 1 hr ago. Bleeding controlled at this time. Last tetanus unknown

## 2022-07-31 NOTE — ED Provider Notes (Signed)
[facility]    [id number] Arrival date & time: 07/31/22  1911    HISTORY   Chief Complaint  Patient presents with   Laceration   HPI Scott Mayo is a pleasant, 32 y.o. male who presents to urgent care today. Pt presents with a laceration on the rt second toe after the pole of a backyard swingset landed on top of it approx 1 hr ago. Pt states initially he didn't realize he was injured then noticed that it was bleeding a lot.  On arrival, bleeding is well controlled. Last tetanus unknown.    Past Medical History:  Diagnosis Date   Anemia    as a baby   DDD (degenerative disc disease), lumbar    Diabetes mellitus without complication    GERD (gastroesophageal reflux disease)    occ   Headache    migraines   Obese    Sleep apnea    not currently using cpap   Patient Active Problem List   Diagnosis Date Noted   OSA (obstructive sleep apnea) 03/04/2017   Major depression, melancholic type 03/16/2016   Type 2 diabetes mellitus without complication, without long-term current use of insulin 01/30/2016   BMI 45.0-49.9, adult 01/29/2016   Degenerative disc disease at L5-S1 level 01/29/2016   Primary snoring 01/29/2016   Thoracic or lumbosacral neuritis or radiculitis, unspecified 10/02/2013   Past Surgical History:  Procedure Laterality Date   NO PAST SURGERIES     VASECTOMY     done in office   VASECTOMY Right 10/27/2021   Procedure: VASECTOMY;  Surgeon: Vanna Scotland, MD;  Location: [facility] ORS;  Service: Urology;  Laterality: Right;    Home Medications    Prior to Admission medications   Medication Sig Start Date End Date Taking? Authorizing Provider  JARDIANCE 25 MG TABS tablet Take 25 mg by mouth every morning. 07/05/21   [provider]  Lancets (FREESTYLE) lancets CHECK FASTING BLOOD SUGAR D 02/03/16   [provider]  oxyCODONE-acetaminophen (PERCOCET) 5-325 MG tablet Take 1-2 tablets by mouth every 4 (four) hours as needed  for moderate pain or severe pain. 10/28/21   Vanna Scotland, MD  traMADol (ULTRAM) 50 MG tablet Take 1 tablet (50 mg total) by mouth every 6 (six) hours as needed. 08/29/21   Vanna Scotland, MD  VICTOZA 18 MG/3ML SOPN Inject 1.2 mg into the skin every morning. 06/05/21   [provider]    Family History Family History  Problem Relation Age of Onset   Prostate cancer Neg Hx    Bladder Cancer Neg Hx    Kidney cancer Neg Hx    Social History Social History   Tobacco Use   Smoking status: Never   Smokeless tobacco: Never  Substance Use Topics   Alcohol use: Yes    Comment: occ   Drug use: Not Currently    Types: Marijuana    Comment: only in high school   Allergies   Patient has no known allergies.  Review of Systems Review of Systems Pertinent findings revealed after performing a 14 point review of systems has been noted in the history of present illness.  Physical Exam Vital Signs BP (!) 135/94 (BP Location: Left Arm)   Pulse 93   Temp 98.7 F (37.1 C) (Oral)   Resp 18   SpO2 100%   No data found.  Physical Exam Vitals and nursing note reviewed.  Constitutional:      General: He is not in acute distress.  Appearance: Normal appearance. He is obese. He is not ill-appearing.  HENT:     Head: Normocephalic and atraumatic.  Eyes:     Extraocular Movements: Extraocular movements intact.     Conjunctiva/sclera: Conjunctivae normal.     Pupils: Pupils are equal, round, and reactive to light.  Cardiovascular:     Rate and Rhythm: Normal rate and regular rhythm.  Pulmonary:     Effort: Pulmonary effort is normal.     Breath sounds: Normal breath sounds.  Musculoskeletal:        General: Normal range of motion.     Cervical back: Normal range of motion and neck supple.  Skin:    General: Skin is warm and dry.     Findings: Lesion (1.5 cm laceration across anterior aspect of left second toe with minimal bleeding, less than 1 mm deep) present.   Neurological:     General: No focal deficit present.     Mental Status: He is alert and oriented to person, place, and time. Mental status is at baseline.  Psychiatric:        Mood and Affect: Mood normal.        Behavior: Behavior normal.        Thought Content: Thought content normal.        Judgment: Judgment normal.     Visual Acuity Right Eye Distance:   Left Eye Distance:   Bilateral Distance:    Right Eye Near:   Left Eye Near:    Bilateral Near:     UC Couse / Diagnostics / Procedures:     Radiology DG Foot Complete Left  Result Date: 07/31/2022 CLINICAL DATA:  Crush injury. EXAM: LEFT FOOT - COMPLETE 3+ VIEW COMPARISON:  None Available. FINDINGS: There is no evidence of fracture or dislocation. Mild degenerative spurring in the dorsal midfoot. Small Achilles tendon enthesophyte. No radiopaque foreign body. IMPRESSION: 1. No fracture or dislocation. 2. Mild degenerative spurring in the dorsal midfoot. Electronically Signed   By: Narda Rutherford M.D.   On: 07/31/2022 19:57    Procedures Procedures (including critical care time) EKG  Pending results:  Labs Reviewed - No data to display  Medications Ordered in UC: Medications  Tdap (BOOSTRIX) injection 0.5 mL (has no administration in time range)    UC Diagnoses / Final Clinical Impressions(s)   I have reviewed the triage vital signs and the nursing notes.  Pertinent labs & imaging results that were available during my care of the patient were reviewed by me and considered in my medical decision making (see chart for details).    Final diagnoses:  Crushing injury of unspecified left toe(s), initial encounter  Laceration of left great toe without foreign body present or damage to nail, initial encounter  Need for Tdap vaccination  Contusion of bone   ***  Please see discharge instructions below for details of plan of care as provided to patient. ED Prescriptions     Medication Sig Dispense Auth. Provider    sulfamethoxazole-trimethoprim (BACTRIM DS) 800-160 MG tablet Take 1 tablet by mouth 2 (two) times daily for 5 days. 10 tablet Theadora Rama Scales, PA-C      PDMP not reviewed this encounter.  Pending results:  Labs Reviewed - No data to display  Discharge Instructions:   Discharge Instructions      I have enclosed some information about caring for the cut on your left second toe.  To prevent infection in the wound, please begin taking Bactrim 1 tablet  twice daily for the next 5 days.  You received Tdap booster during your visit today.  Your next Tdap will be due in 10 years, between 5 and 10 years if you are injured again.  You may find that your toe is quite sore to the touch and sore when walking or bearing weight for several more days.  I believe that you have bruised your bone.  The good news is that nothing is broken.  Thank you for visiting urgent care today.      Disposition Upon Discharge:  Condition: stable for discharge home  Patient presented with an acute illness with associated systemic symptoms and significant discomfort requiring urgent management. In my opinion, this is a condition that a prudent lay person (someone who possesses an average knowledge of health and medicine) may potentially expect to result in complications if not addressed urgently such as respiratory distress, impairment of bodily function or dysfunction of bodily organs.   Routine symptom specific, illness specific and/or disease specific instructions were discussed with the patient and/or caregiver at length.   As such, the patient has been evaluated and assessed, work-up was performed and treatment was provided in alignment with urgent care protocols and evidence based medicine.  Patient/parent/caregiver has been advised that the patient may require follow up for further testing and treatment if the symptoms continue in spite of treatment, as clinically indicated and  appropriate.  Patient/parent/caregiver has been advised to return to the [facility] or PCP if no better; to PCP or the Emergency Department if new signs and symptoms develop, or if the current signs or symptoms continue to change or worsen for further workup, evaluation and treatment as clinically indicated and appropriate  The patient will follow up with their current PCP if and as advised. If the patient does not currently have a PCP we will assist them in obtaining one.   The patient may need specialty follow up if the symptoms continue, in spite of conservative treatment and management, for further workup, evaluation, consultation and treatment as clinically indicated and appropriate.  Patient/parent/caregiver verbalized understanding and agreement of plan as discussed.  All questions were addressed during visit.  Please see discharge instructions below for further details of plan.  This office note has been dictated using Teaching laboratory technician.  Unfortunately, this method of dictation can sometimes lead to typographical or grammatical errors.  I apologize for your inconvenience in advance if this occurs.  Please do not hesitate to reach out to me if clarification is needed.

## 2022-07-31 NOTE — Discharge Instructions (Addendum)
I have enclosed some information about caring for the cut on your left second toe.  To prevent infection in the wound, please begin taking Bactrim 1 tablet twice daily for the next 5 days.  You received Tdap booster during your visit today.  Your next Tdap will be due in 10 years, between 5 and 10 years if you are injured again.  You may find that your toe is quite sore to the touch and sore when walking or bearing weight for several more days.  I believe that you have bruised your bone.  The good news is that nothing is broken.  Thank you for visiting urgent care today.

## 2022-09-02 ENCOUNTER — Ambulatory Visit
Admission: RE | Admit: 2022-09-02 | Discharge: 2022-09-02 | Disposition: A | Discharge status: Home / Self Care | Payer: No Typology Code available for payment source | Source: Ambulatory Visit | Attending: Family Medicine | Admitting: Family Medicine

## 2022-09-02 VITALS — BP 149/103 | HR 100 | Temp 98.5°F | Resp 16

## 2022-09-02 DIAGNOSIS — J069 Acute upper respiratory infection, unspecified: Secondary | ICD-10-CM | POA: Diagnosis not present

## 2022-09-02 MED ORDER — HYDROCODONE BIT-HOMATROP MBR 5-1.5 MG/5ML PO SOLN: 5.0000 mL | Freq: Four times a day (QID) | ORAL | 0 refills | Status: DC | PRN

## 2022-09-02 NOTE — Discharge Instructions (Signed)
Be aware, your cough medication may cause drowsiness. Please do not drive, operate heavy machinery or make important decisions while on this medication, it can cloud your judgement.  

## 2022-09-02 NOTE — ED Triage Notes (Signed)
Pt c/o cough, nasal drip, and a "margarita burn" from cruise last week.

## 2022-09-02 NOTE — ED Provider Notes (Signed)
[facility]   [id number] 09/02/22 Arrival Time: 1841  ASSESSMENT & PLAN:  1. Viral URI with cough    Discussed typical duration of likely viral illness. No respiratory distress. Work note provided. OTC symptom care as needed.  Discharge Medication List as of 09/02/2022  7:35 PM     START taking these medications   Details  HYDROcodone bit-homatropine (HYCODAN) 5-1.5 MG/5ML syrup Take 5 mLs by mouth every 6 (six) hours as needed for cough., Starting Wed 09/02/2022, Normal         Follow-up Information     Schedule an appointment as soon as possible for a visit  with Center, [facility]   Specialty: General Practice Why: To recheck your blood pressure. Contact information: [address] [place] [address] [phone]         [facility]).   Specialty: Urgent Care Why: As needed. Contact information: [address] [phone] mc [place] [address] [phone]                Reviewed expectations re: course of current medical issues. Questions answered. Outlined signs and symptoms indicating need for more acute intervention. Understanding verbalized. After Visit Summary given.   SUBJECTIVE: History from: Patient. Scott Mayo is a 32 y.o. male. Pt c/o cough, nasal drip; abrupt onset; x 4-5 days at most; son with similar. Denies: fever and difficulty breathing. Normal PO intake without n/v/d.  OBJECTIVE:  Vitals:   09/02/22 1913  BP: (!) 149/103  Pulse: 100  Resp: 16  Temp: 98.5 F (36.9 C)  TempSrc: Oral  SpO2: 95%    General appearance: alert; no distress Eyes: PERRLA; EOMI; conjunctiva normal HENT: [place]; AT; with nasal congestion Neck: supple  Lungs: speaks full sentences without difficulty; unlabored; CTAB; dry cough Extremities: no edema Skin: warm and dry Neurologic: normal gait Psychological: alert and cooperative;  normal mood and affect  No Known Allergies  Past Medical History:  Diagnosis Date   Anemia    as a baby   DDD (degenerative disc disease), lumbar    Diabetes mellitus without complication ([facility])    GERD (gastroesophageal reflux disease)    occ   Headache    migraines   Obese    Sleep apnea    not currently using cpap   Social History   Socioeconomic History   Marital status: Married    Spouse name: Not on file   Number of children: Not on file   Years of education: Not on file   Highest education level: Not on file  Occupational History   Not on file  Tobacco Use   Smoking status: Never   Smokeless tobacco: Never  Substance and Sexual Activity   Alcohol use: Yes    Comment: occ   Drug use: Not Currently    Types: Marijuana    Comment: only in high school   Sexual activity: Not on file  Other Topics Concern   Not on file  Social History Narrative   Not on file   Social Determinants of Health   Financial Resource Strain: Not on file  Food Insecurity: Not on file  Transportation Needs: Not on file  Physical Activity: Not on file  Stress: Not on file  Social Connections: Not on file  Intimate Partner Violence: Not on file   Family History  Problem Relation Age of Onset   Prostate cancer Neg Hx    Bladder Cancer  Neg Hx    Kidney cancer Neg Hx    Past Surgical History:  Procedure Laterality Date   NO PAST SURGERIES     VASECTOMY     done in office   VASECTOMY Right 10/27/2021   Procedure: VASECTOMY;  Surgeon: Vanna Scotland, MD;  Location: [facility] ORS;  Service: Urology;  Laterality: Right;     Mardella Layman, MD 09/02/22 [phone]

## 2022-09-17 ENCOUNTER — Ambulatory Visit
Admission: EM | Admit: 2022-09-17 | Discharge: 2022-09-17 | Disposition: A | Discharge status: Home / Self Care | Payer: No Typology Code available for payment source | Attending: Family Medicine | Admitting: Family Medicine

## 2022-09-17 DIAGNOSIS — J019 Acute sinusitis, unspecified: Secondary | ICD-10-CM

## 2022-09-17 MED ORDER — CEFDINIR 300 MG PO CAPS: 600.0000 mg | ORAL_CAPSULE | Freq: Every day | ORAL | 0 refills | Status: AC

## 2022-09-17 MED ORDER — BENZONATATE 100 MG PO CAPS: 100.0000 mg | ORAL_CAPSULE | Freq: Three times a day (TID) | ORAL | 0 refills | Status: AC | PRN

## 2022-09-17 NOTE — ED Provider Notes (Signed)
[facility]    CSN: [id number] Arrival date & time: 09/17/22  1651      History   Chief Complaint Chief Complaint  Patient presents with   URI    Entered by patient   Cough    HPI Scott Mayo is a 32 y.o. male.    URI Presenting symptoms: cough   Cough  Here for congestion and cough that is been bothering him since May.  He was seen at one of our locations on May 15 prescribe some cough syrup, as it that time it was felt to be most likely a viral infection  Since then he has had a lot of postnasal drainage and it has been worsening in the last week.  He occasionally has some sinus pressure and congestion.  No fever and no vomiting.  The cough is making it difficult to work  He has no allergy medication  He does have diabetes and that control is improving lately.  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   Type 2 diabetes mellitus without complication, without long-term current use of insulin (HCC) 01/30/2016   BMI 45.0-49.9, adult (HCC) 01/29/2016   Degenerative disc disease at L5-S1 level 01/29/2016   Primary snoring 01/29/2016   Thoracic or lumbosacral neuritis or radiculitis, unspecified 10/02/2013    Past Surgical History:  Procedure Laterality Date   NO PAST SURGERIES     VASECTOMY     done in office   VASECTOMY Right 10/27/2021   Procedure: VASECTOMY;  Surgeon: Vanna Scotland, MD;  Location: [facility];  Service: Urology;  Laterality: Right;       Home Medications    Prior to Admission medications   Medication Sig Start Date End Date Taking?  Authorizing Provider  benzonatate (TESSALON) 100 MG capsule Take 1 capsule (100 mg total) by mouth 3 (three) times daily as needed for cough. 09/17/22  Yes Zenia Resides, MD  cefdinir (OMNICEF) 300 MG capsule Take 2 capsules (600 mg total) by mouth daily for 7 days. 09/17/22 09/24/22 Yes Machell Wirthlin, Janace Aris, MD  JARDIANCE 25 MG TABS tablet Take 25 mg by mouth every morning. 07/05/21  Yes [provider]  Lancets (FREESTYLE) lancets CHECK FASTING BLOOD SUGAR D 02/03/16  Yes [provider]  MOUNJARO 5 MG/0.5ML Pen Inject into the skin. 09/03/22  Yes [provider]  VICTOZA 18 MG/3ML SOPN Inject 1.2 mg into the skin every morning. 06/05/21   [provider]    Family History Family History  Problem Relation Age of Onset   Prostate cancer Neg Hx    Bladder Cancer Neg Hx    Kidney cancer Neg Hx     Social History Social History   Tobacco Use   Smoking status: Never   Smokeless tobacco: Never  Substance Use Topics   Alcohol use: Yes    Comment: occ   Drug use: Not Currently    Types: Marijuana    Comment: only in high school     Allergies  Patient has no known allergies.   Review of Systems Review of Systems  Respiratory:  Positive for cough.      Physical Exam Triage Vital Signs ED Triage Vitals [09/17/22 1748]  Enc Vitals Group     BP 125/84     Pulse Rate 85     Resp 17     Temp 98.7 F (37.1 C)     Temp Source Oral     SpO2 95 %     Weight      Height      Head Circumference      Peak Flow      Pain Score 0     Pain Loc      Pain Edu?      Excl. in GC?    No data found.  Updated Vital Signs BP 125/84 (BP Location: Left Arm)   Pulse 85   Temp 98.7 F (37.1 C) (Oral)   Resp 17   SpO2 95%   Visual Acuity Right Eye Distance:   Left Eye Distance:   Bilateral Distance:    Right Eye Near:   Left Eye Near:    Bilateral Near:     Physical Exam Vitals reviewed.  Constitutional:      General: He is not in acute  distress.    Appearance: He is not ill-appearing, toxic-appearing or diaphoretic.  HENT:     Nose: Congestion present.     Mouth/Throat:     Mouth: Mucous membranes are moist.     Comments: There is mild erythema of the posterior oropharynx and there is 1+ hypertrophy of the tonsils.  There is white mucus draining in the oropharynx. Eyes:     Extraocular Movements: Extraocular movements intact.     Conjunctiva/sclera: Conjunctivae normal.     Pupils: Pupils are equal, round, and reactive to light.  Cardiovascular:     Rate and Rhythm: Normal rate and regular rhythm.     Heart sounds: No murmur heard. Pulmonary:     Effort: Pulmonary effort is normal. No respiratory distress.     Breath sounds: No stridor. No wheezing, rhonchi or rales.  Musculoskeletal:     Cervical back: Neck supple.  Lymphadenopathy:     Cervical: No cervical adenopathy.  Skin:    Capillary Refill: Capillary refill takes less than 2 seconds.     Coloration: Skin is not jaundiced or pale.  Neurological:     General: No focal deficit present.     Mental Status: He is alert and oriented to person, place, and time.  Psychiatric:        Behavior: Behavior normal.      UC Treatments / Results  Labs (all labs ordered are listed, but only abnormal results are displayed) Labs Reviewed - No data to display  EKG   Radiology No results found.  Procedures Procedures (including critical care time)  Medications Ordered in UC Medications - No data to display  Initial Impression / Assessment and Plan / UC Course  I have reviewed the triage vital signs and the nursing notes.  Pertinent labs & imaging results that were available during my care of the patient were reviewed by me and considered in my medical decision making (see chart for details).        I think his symptoms and length of time is that his symptoms are most consistent with an acute sinusitis now.  Scott Mayo is sent in and Scott Mayo are  sent in for the  cough Also I recommended taking Mucinex D.  He does not take medications for hypertension his blood pressure is normal here  Final Clinical Impressions(s) / UC Diagnoses   Final diagnoses:  Acute sinusitis, recurrence not specified, unspecified location     Discharge Instructions      Take cefdinir 300 mg--2 capsules together daily for 7 days  Take benzonatate 100 mg, 1 tab every 8 hours as needed for cough.  He may also take Mucinex D as needed for the congestion.  Make sure you are drinking plenty of fluids.       ED Prescriptions     Medication Sig Dispense Auth. Provider   cefdinir (OMNICEF) 300 MG capsule Take 2 capsules (600 mg total) by mouth daily for 7 days. 14 capsule Latavion Halls, Janace Aris, MD   benzonatate (TESSALON) 100 MG capsule Take 1 capsule (100 mg total) by mouth 3 (three) times daily as needed for cough. 21 capsule Zenia Resides, MD      PDMP not reviewed this encounter.   Zenia Resides, MD 09/17/22 [phone]

## 2022-09-17 NOTE — Discharge Instructions (Signed)
Take cefdinir 300 mg--2 capsules together daily for 7 days  Take benzonatate 100 mg, 1 tab every 8 hours as needed for cough.  He may also take Mucinex D as needed for the congestion.  Make sure you are drinking plenty of fluids.

## 2022-09-17 NOTE — ED Triage Notes (Signed)
Pt c/o cough, congestion that started on  08/26/22 and has not gone away or got better. Taking nucin cough drops, and hycodan cough syrup that he was prescribed on 09/02/22.

## 2022-12-21 ENCOUNTER — Ambulatory Visit
Admission: EM | Admit: 2022-12-21 | Discharge: 2022-12-21 | Disposition: A | Discharge status: Home / Self Care | Payer: No Typology Code available for payment source | Attending: Internal Medicine | Admitting: Internal Medicine

## 2022-12-21 DIAGNOSIS — M65331 Trigger finger, right middle finger: Secondary | ICD-10-CM

## 2022-12-21 NOTE — Discharge Instructions (Signed)
Splint has been applied.  Take ibuprofen as needed.  Follow-up with hand specialist if symptoms persist or worsen.

## 2022-12-21 NOTE — ED Provider Notes (Signed)
[facility]    CSN: [id number] Arrival date & time: 12/21/22  1357      History   Chief Complaint Chief Complaint  Patient presents with   Hand Pain    HPI Scott Mayo is a 32 y.o. male.   Patient presents with pain to right third digit that extends down to palm of hand.  Patient is not sure exactly when this started.  Reports pain is worsened.  He has been using gentle stretches with no improvement.  Patient states that it sometimes gets stuck when he bends it.  Denies any injury to the area.  Denies numbness or tingling.   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   Type 2 diabetes mellitus without complication, without long-term current use of insulin (HCC) 01/30/2016   BMI 45.0-49.9, adult (HCC) 01/29/2016   Degenerative disc disease at L5-S1 level 01/29/2016   Primary snoring 01/29/2016   Thoracic or lumbosacral neuritis or radiculitis, unspecified 10/02/2013    Past Surgical History:  Procedure Laterality Date   NO PAST SURGERIES     VASECTOMY     done in office   VASECTOMY Right 10/27/2021   Procedure: VASECTOMY;  Surgeon: Vanna Scotland, MD;  Location: [facility];  Service: Urology;  Laterality: Right;       Home Medications    Prior to Admission medications   Medication Sig Start Date End Date Taking? Authorizing Provider  benzonatate (TESSALON) 100 MG capsule Take 1 capsule (100 mg total) by mouth 3 (three) times daily as needed for cough. 09/17/22   Zenia Resides, MD  JARDIANCE 25 MG TABS tablet Take 25 mg by mouth every morning. 07/05/21    [provider]  Lancets (FREESTYLE) lancets CHECK FASTING BLOOD SUGAR D 02/03/16   [provider]  MOUNJARO 5 MG/0.5ML Pen Inject into the skin. 09/03/22   [provider]  VICTOZA 18 MG/3ML SOPN Inject 1.2 mg into the skin every morning. 06/05/21   [provider]    Family History Family History  Problem Relation Age of Onset   Prostate cancer Neg Hx    Bladder Cancer Neg Hx    Kidney cancer Neg Hx     Social History Social History   Tobacco Use   Smoking status: Never   Smokeless tobacco: Never  Substance Use Topics   Alcohol use: Yes    Comment: occ   Drug use: Not Currently    Types: Marijuana    Comment: only in high school     Allergies   Patient has no known allergies.   Review of Systems Review of Systems Per HPI  Physical Exam Triage Vital Signs ED Triage Vitals  Encounter Vitals Group     BP 12/21/22 1507 130/88     Systolic BP Percentile --      Diastolic BP Percentile --      Pulse Rate 12/21/22 1507 72     Resp 12/21/22 1507 16     Temp  12/21/22 1507 98.2 F (36.8 C)     Temp Source 12/21/22 1507 Oral     SpO2 12/21/22 1507 96 %     Weight --      Height --      Head Circumference --      Peak Flow --      Pain Score 12/21/22 1508 7     Pain Loc --      Pain Education --      Exclude from Growth Chart --    No data found.  Updated Vital Signs BP 130/88 (BP Location: Left Arm)   Pulse 72   Temp 98.2 F (36.8 C) (Oral)   Resp 16   SpO2 96%   Visual Acuity Right Eye Distance:   Left Eye Distance:   Bilateral Distance:    Right Eye Near:   Left Eye Near:    Bilateral Near:     Physical Exam Constitutional:      General: He is not in acute distress.    Appearance: Normal appearance. He is not toxic-appearing or diaphoretic.  HENT:     Head: Normocephalic and atraumatic.  Eyes:     Extraocular Movements: Extraocular movements intact.     Conjunctiva/sclera: Conjunctivae normal.   Pulmonary:     Effort: Pulmonary effort is normal.  Musculoskeletal:     Comments: Patient has tenderness to palpation overlying the MCP joint of the right third digit.  No tenderness to finger.  Finger does appear to be getting stuck at certain portions with ROM but patient is able to fully extend finger.  Capillary refill and pulses intact.  No swelling or discoloration.  No other tenderness to other fingers or other parts of hand.  Neurological:     General: No focal deficit present.     Mental Status: He is alert and oriented to person, place, and time. Mental status is at baseline.  Psychiatric:        Mood and Affect: Mood normal.        Behavior: Behavior normal.        Thought Content: Thought content normal.        Judgment: Judgment normal.      UC Treatments / Results  Labs (all labs ordered are listed, but only abnormal results are displayed) Labs Reviewed - No data to display  EKG   Radiology No results found.  Procedures Procedures (including critical care time)  Medications Ordered in UC Medications - No data to display  Initial Impression / Assessment and Plan / UC Course  I have reviewed the triage vital signs and the nursing notes.  Pertinent labs & imaging results that were available during my care of the patient were reviewed by me and considered in my medical decision making (see chart for details).     Physical exam is consistent with trigger finger.  Finger splint applied by clinical staff.  Advised NSAIDs over-the-counter.  Advised following up with hand specialist at provided contact information if symptoms persist or worsen.  Patient verbalized understanding and was agreeable with plan. Final Clinical Impressions(s) / UC Diagnoses   Final diagnoses:  Trigger middle finger of right hand     Discharge Instructions      Splint has been applied.  Take ibuprofen as needed.  Follow-up with hand specialist if symptoms persist or  worsen.    ED Prescriptions   None    PDMP not reviewed this encounter.   Gustavus Bryant, [place] 12/21/22  1554  

## 2022-12-21 NOTE — ED Triage Notes (Signed)
Pt states pain to right hand and middle finger.  Denies any injury. States his middle finger sometimes gets stuck when he bends it.  Has been doing stretches at home with no relief.

## 2022-12-22 ENCOUNTER — Ambulatory Visit: Payer: No Typology Code available for payment source

## 2023-05-24 ENCOUNTER — Emergency Department (HOSPITAL_BASED_OUTPATIENT_CLINIC_OR_DEPARTMENT_OTHER): Payer: No Typology Code available for payment source | Admitting: Radiology

## 2023-05-24 ENCOUNTER — Other Ambulatory Visit: Payer: Self-pay

## 2023-05-24 ENCOUNTER — Emergency Department (HOSPITAL_BASED_OUTPATIENT_CLINIC_OR_DEPARTMENT_OTHER)
Admission: EM | Admit: 2023-05-24 | Discharge: 2023-05-24 | Disposition: A | Discharge status: Home / Self Care | Payer: No Typology Code available for payment source

## 2023-05-24 DIAGNOSIS — Z7984 Long term (current) use of oral hypoglycemic drugs: Secondary | ICD-10-CM | POA: Insufficient documentation

## 2023-05-24 DIAGNOSIS — M542 Cervicalgia: Secondary | ICD-10-CM | POA: Insufficient documentation

## 2023-05-24 DIAGNOSIS — E1165 Type 2 diabetes mellitus with hyperglycemia: Secondary | ICD-10-CM | POA: Insufficient documentation

## 2023-05-24 DIAGNOSIS — M79602 Pain in left arm: Secondary | ICD-10-CM | POA: Insufficient documentation

## 2023-05-24 DIAGNOSIS — R079 Chest pain, unspecified: Secondary | ICD-10-CM | POA: Insufficient documentation

## 2023-05-24 DIAGNOSIS — R200 Anesthesia of skin: Secondary | ICD-10-CM | POA: Diagnosis not present

## 2023-05-24 LAB — CBC
HCT: 48.6 % (ref 39.0–52.0)
Hemoglobin: 16.7 g/dL (ref 13.0–17.0)
MCH: 31.3 pg (ref 26.0–34.0)
MCHC: 34.4 g/dL (ref 30.0–36.0)
MCV: 91 fL (ref 80.0–100.0)
Platelets: 228 10*3/uL (ref 150–400)
RBC: 5.34 MIL/uL (ref 4.22–5.81)
RDW: 13.1 % (ref 11.5–15.5)
WBC: 10 10*3/uL (ref 4.0–10.5)
nRBC: 0 % (ref 0.0–0.2)

## 2023-05-24 LAB — BASIC METABOLIC PANEL
Anion gap: 9 (ref 5–15)
BUN: 20 mg/dL (ref 6–20)
CO2: 22 mmol/L (ref 22–32)
Calcium: 9.7 mg/dL (ref 8.9–10.3)
Chloride: 106 mmol/L (ref 98–111)
Creatinine, Ser: 0.7 mg/dL (ref 0.61–1.24)
GFR, Estimated: >60 mL/min (ref >60)
Glucose, Bld: 110 mg/dL — ABNORMAL HIGH (ref 70–99)
Potassium: 3.9 mmol/L (ref 3.5–5.1)
Sodium: 137 mmol/L (ref 135–145)

## 2023-05-24 LAB — TROPONIN I (HIGH SENSITIVITY)
Troponin I (High Sensitivity): <2 ng/L (ref <18)
Troponin I (High Sensitivity): <2 ng/L (ref <18)

## 2023-05-24 NOTE — ED Triage Notes (Signed)
Pt caox4 c/o CP with tightness and numbness in L chest, shoulder and arm stating that he woke up to it approx 0430 this morning.

## 2023-05-24 NOTE — ED Provider Triage Note (Signed)
Emergency Medicine Provider Triage Evaluation Note  Scott Mayo , a 33 y.o. male  was evaluated in triage.  Pt complains of pain of left side of lower head in occipital region into neck arm, chest, left arm that woke him up at 0300 this morning. Has progressed into numbness. One episode of nausea. Had APAP 1000mg  without much relief  Mother hx of MI, stent Brother hx of MI, stent  Review of Systems  Positive: See hpi Negative: Visual disturbances  Physical Exam  BP 116/84 (BP Location: Left Arm)   Pulse 77   Temp 98 F (36.7 C)   Resp 20   Ht 5\' 9"  (1.753 m)   Wt 115.2 kg   SpO2 100%   BMI 37.51 kg/m  Gen:   Awake, no distress   Resp:  Normal effort  MSK:   Moves extremities without difficulty  Other:  Neuro intact  Medical Decision Making  Medically screening exam initiated at 3:54 PM.  Appropriate orders placed.  Scott Mayo was informed that the remainder of the evaluation will be completed by another provider, this initial triage assessment does not replace that evaluation, and the importance of remaining in the ED until their evaluation is complete.  Labs so far unremarkable. Cxr neg. Trop neg x 1,   Judithann Sheen, [place] 05/24/23 [phone]

## 2023-05-24 NOTE — ED Provider Notes (Signed)
[place] [facility] DEPARTMENT AT [facility] Provider Note   CSN: [id number] Arrival date & time: 05/24/23  1204     History {Add pertinent medical, surgical, social history, OB history to HPI:1} Chief Complaint  Patient presents with   Chest Pain    Scott Mayo is a 33 y.o. male.   Chest Pain      Home Medications Prior to Admission medications   Medication Sig Start Date End Date Taking? Authorizing Provider  benzonatate (TESSALON) 100 MG capsule Take 1 capsule (100 mg total) by mouth 3 (three) times daily as needed for cough. 09/17/22   Zenia Resides, MD  JARDIANCE 25 MG TABS tablet Take 25 mg by mouth every morning. 07/05/21   [provider]  Lancets (FREESTYLE) lancets CHECK FASTING BLOOD SUGAR D 02/03/16   [provider]  MOUNJARO 5 MG/0.5ML Pen Inject into the skin. 09/03/22   [provider]  VICTOZA 18 MG/3ML SOPN Inject 1.2 mg into the skin every morning. 06/05/21   [provider]      Allergies    Patient has no known allergies.    Review of Systems   Review of Systems  Cardiovascular:  Positive for chest pain.    Physical Exam Updated Vital Signs BP 125/83 (BP Location: Right Arm)   Pulse 78   Temp 98 F (36.7 C)   Resp 16   Ht 5\' 9"  (1.753 m)   Wt 115.2 kg   SpO2 99%   BMI 37.51 kg/m  Physical Exam  ED Results / Procedures / Treatments   Labs (all labs ordered are listed, but only abnormal results are displayed) Labs Reviewed  BASIC METABOLIC PANEL - Abnormal; Notable for the following components:      Result Value   Glucose, Bld 110 (*)    All other components within normal limits  CBC  TROPONIN I (HIGH SENSITIVITY)  TROPONIN I (HIGH SENSITIVITY)    EKG EKG Interpretation Date/Time:  Monday May 24 2023 12:10:33 EST Ventricular Rate:  82 PR Interval:  142 QRS Duration:  90 QT Interval:  362 QTC Calculation: 422 R Axis:   33  Text Interpretation: Normal sinus rhythm  Possible Lateral infarct , age undetermined Abnormal ECG axis change Otherwise no significant change Confirmed by Melene Plan [phone]) on 05/24/2023 3:43:28 PM  Radiology DG Chest 2 View Result Date: 05/24/2023 CLINICAL DATA:  Left-sided chest pain radiating to left arm. EXAM: CHEST - 2 VIEW COMPARISON:  None Available. FINDINGS: The heart size and mediastinal contours are within normal limits. Both lungs are clear. The visualized skeletal structures are unremarkable. IMPRESSION: No active cardiopulmonary disease. Electronically Signed   By: Danae Orleans M.D.   On: 05/24/2023 13:21    Procedures Procedures  {Document cardiac monitor, telemetry assessment procedure when appropriate:1}  Medications Ordered in ED Medications - No data to display  ED Course/ Medical Decision Making/ A&P   {   Click here for ABCD2, HEART and other calculatorsREFRESH Note before signing :1}                              Medical Decision Making  ***  {Document critical care time when appropriate:1} {Document review of labs and clinical decision tools ie heart score, Chads2Vasc2 etc:1}  {Document your independent review of radiology images, and any outside records:1} {Document your discussion with family members, caretakers, and with consultants:1} {Document social determinants of health affecting pt's care:1} {  Document your decision making why or why not admission, treatments were needed:1} Final Clinical Impression(s) / ED Diagnoses Final diagnoses:  None    Rx / DC Orders ED Discharge Orders     None

## 2024-03-01 ENCOUNTER — Ambulatory Visit
Admission: RE | Admit: 2024-03-01 | Discharge: 2024-03-01 | Disposition: A | Discharge status: Home / Self Care | Source: Ambulatory Visit | Attending: Family Medicine | Admitting: Family Medicine

## 2024-03-01 VITALS — BP 107/69 | HR 84 | Temp 98.0°F | Resp 20 | Ht 69.0 in | Wt 250.0 lb

## 2024-03-01 DIAGNOSIS — Z13228 Encounter for screening for other metabolic disorders: Secondary | ICD-10-CM | POA: Insufficient documentation

## 2024-03-01 DIAGNOSIS — N481 Balanitis: Secondary | ICD-10-CM

## 2024-03-01 MED ORDER — FLUCONAZOLE 150 MG PO TABS: ORAL_TABLET | ORAL | 0 refills | Status: DC

## 2024-03-01 NOTE — ED Triage Notes (Signed)
 Patient reports wife had a yeast infection recently and had unprotected intercourse so now believes he has yeast rash on end on penis/foreskin. No PCP appt yet but follow up pending. No dysuria. No penis discharge. No exposure to STI (with recent testing done, negative).

## 2024-03-01 NOTE — Discharge Instructions (Addendum)
 You may use over the counter clotrimazole cream twice daily for up to one week.

## 2024-03-01 NOTE — ED Provider Notes (Signed)
 [facility]   [id number] 03/01/24 Arrival Time: 1648  ASSESSMENT & PLAN:  1. Balanitis    No signs of bacterial skin infection.    Discharge Instructions      You may use over the counter clotrimazole cream twice daily for up to one week.    Meds ordered this encounter  Medications   fluconazole (DIFLUCAN) 150 MG tablet    Sig: Take one tablet by mouth as a single dose. May repeat in 3 days if symptoms persist.    Dispense:  2 tablet    Refill:  0   Keep area clean and dry.  May f/u here if not improving.  Reviewed expectations re: course of current medical issues. Questions answered. Outlined signs and symptoms indicating need for more acute intervention. Patient verbalized understanding. After Visit Summary given.   SUBJECTIVE:  Scott Mayo is a 33 y.o. male who presents with possible balanitis; distant h/o. Reports irritation of head of penis; is uncircumcised. Reports wife recently with yeast infection; noted symptoms after intercourse. Normal urination.  OBJECTIVE:  Vitals:   03/01/24 1700 03/01/24 1705  BP:  107/69  Pulse:  84  Resp:  20  Temp:  98 F (36.7 C)  TempSrc:  Oral  SpO2:  95%  Weight: 113.4 kg   Height: 5' 9 (1.753 m)     General appearance: alert, cooperative, appears stated age and no distress GU: mild irritation of glans penis/foreskin; with minimal weepy moistness Skin: warm and dry Psychological: alert and cooperative; normal mood and affect.    Labs Reviewed - No data to display  No Known Allergies  Past Medical History:  Diagnosis Date   Anemia    as a baby   DDD (degenerative disc disease), lumbar    Diabetes mellitus without complication (HCC)    GERD (gastroesophageal reflux disease)    occ   Headache    migraines   Obese    Sleep apnea    not currently using cpap   Family History  Problem Relation Age of Onset   Prostate cancer Neg Hx    Bladder Cancer Neg Hx    Kidney cancer Neg Hx     Social History   Socioeconomic History   Marital status: Married    Spouse name: Not on file   Number of children: Not on file   Years of education: Not on file   Highest education level: Not on file  Occupational History   Not on file  Tobacco Use   Smoking status: Never    Passive exposure: Never   Smokeless tobacco: Never  Vaping Use   Vaping status: Never Used  Substance and Sexual Activity   Alcohol use: Yes    Comment: occ   Drug use: Not Currently    Types: Marijuana    Comment: only in high school   Sexual activity: Yes    Birth control/protection: None  Other Topics Concern   Not on file  Social History Narrative   Not on file   Social Drivers of Health   Financial Resource Strain: Not on file  Food Insecurity: Not on file  Transportation Needs: Not on file  Physical Activity: Not on file  Stress: Not on file (02/23/2023)  Social Connections: Not on file  Intimate Partner Violence: Low Risk (08/30/2019)   Received from [facility]   Intimate Partner Violence    Insults You: Not on file    Threatens You: Not on file  Screams at You: Not on file    Physically Hurt: Not on file    Intimate Partner Violence Score: Not on file           Rolinda Rogue, MD 03/01/24 1806

## 2024-03-01 NOTE — ED Triage Notes (Signed)
 Rash caused by Balantis. - Entered by patient

## 2024-04-04 ENCOUNTER — Telehealth: Admitting: Family Medicine

## 2024-04-04 DIAGNOSIS — N481 Balanitis: Secondary | ICD-10-CM

## 2024-04-04 MED ORDER — FLUCONAZOLE 150 MG PO TABS: 150.0000 mg | ORAL_TABLET | ORAL | 0 refills | Status: AC

## 2024-04-04 NOTE — Progress Notes (Signed)
 Virtual Visit Consent   Scott Mayo, you are scheduled for a virtual visit with a [facility] provider today. Just as with appointments in the office, your consent must be obtained to participate. Your consent will be active for this visit and any virtual visit you may have with one of our providers in the next 365 days. If you have a MyChart account, a copy of this consent can be sent to you electronically.  As this is a virtual visit, video technology does not allow for your provider to perform a traditional examination. This may limit your provider's ability to fully assess your condition. If your provider identifies any concerns that need to be evaluated in person or the need to arrange testing (such as labs, EKG, etc.), we will make arrangements to do so. Although advances in technology are sophisticated, we cannot ensure that it will always work on either your end or our end. If the connection with a video visit is poor, the visit may have to be switched to a telephone visit. With either a video or telephone visit, we are not always able to ensure that we have a secure connection.  By engaging in this virtual visit, you consent to the provision of healthcare and authorize for your insurance to be billed (if applicable) for the services provided during this visit. Depending on your insurance coverage, you may receive a charge related to this service.  I need to obtain your verbal consent now. Are you willing to proceed with your visit today? Scott Mayo has provided verbal consent on 04/04/2024 for a virtual visit (video or telephone). Chiquita CHRISTELLA Barefoot, NP  Date: 04/04/2024 1:47 PM   Virtual Visit via Video Note   I, Chiquita CHRISTELLA Barefoot, connected with  Scott Mayo  ([id number], 02-25-1991) on 04/04/2024 at  1:45 PM EST by a video-enabled telemedicine application and verified that I am speaking with the correct person using two identifiers.  Location: Patient: Virtual Visit Location  Patient: Home Provider: Virtual Visit Location Provider: Home Office   I discussed the limitations of evaluation and management by telemedicine and the availability of in person appointments. The patient expressed understanding and agreed to proceed.    History of Present Illness: Scott Mayo is a 33 y.o. who identifies as a male who was assigned male at birth, and is being seen today for recurrent balanitis. Reports he is uncircumcised and there is irritation on the foreskin. His wife has had yeast infections, and noted they symptoms reoccurred after this. She has since been treated, but he needs to get treatment again prior to intercourse. No other new symptoms. Wife was screen for STIs at her appt and negative. No UTI symptoms No fevers, chills, pelvic pain. No known STI exposure.     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  Resting comfortably  at home.  Head is normocephalic, atraumatic.  No labored breathing.  Speech is clear and coherent with logical content.  Patient is alert and oriented at baseline.    Assessment and Plan: 1. Balanitis (Primary) - fluconazole  (DIFLUCAN ) 150 MG tablet; Take 1 tablet  (150 mg total) by mouth as directed. Repeat in 3 days as needed  Dispense: 2 tablet; Refill: 0  Keep area clean and dry. Follow up with PCP if not improving   Patient verbalized understanding.    Reviewed side effects, risks and benefits of medication.    Patient acknowledged agreement and understanding of the plan.   Past Medical, Surgical, Social History, Allergies, and Medications have been Reviewed.     Follow Up Instructions: I discussed the assessment and treatment plan with the patient. The patient was provided an opportunity to ask questions and all were answered. The patient agreed with the plan and demonstrated an understanding of the instructions.  A copy of instructions were sent to the patient via MyChart unless otherwise noted below.    The patient was advised to call back or seek an in-person evaluation if the symptoms worsen or if the condition fails to improve as anticipated.    Chiquita CHRISTELLA Barefoot, NP     [1] No Known Allergies [2]  Current Outpatient Medications:    fluconazole  (DIFLUCAN ) 150 MG tablet, Take 1 tablet (150 mg total) by mouth as directed. Repeat in 3 days as needed, Disp: 2 tablet, Rfl: 0   benzonatate  (TESSALON ) 100 MG capsule, Take 1 capsule (100 mg total) by mouth 3 (three) times daily as needed for cough., Disp: 21 capsule, Rfl: 0   cyclobenzaprine (FLEXERIL) 10 MG tablet, Take 10 mg by mouth 3 (three) times daily as needed., Disp: , Rfl:    empagliflozin (JARDIANCE) 10 MG TABS tablet, Take 25 mg by mouth daily., Disp: , Rfl:    famotidine  (PEPCID ) 20 MG tablet, Take 20 mg by mouth 2 (two) times daily., Disp: , Rfl:    JARDIANCE 25 MG TABS tablet, Take 25 mg by mouth every morning., Disp: , Rfl:    Lancets (FREESTYLE) lancets, CHECK FASTING BLOOD SUGAR D, Disp: , Rfl:    MOUNJARO 5 MG/0.5ML Pen, Inject 15 mg into the skin once a week., Disp: , Rfl:    UNABLE TO FIND, Med Name: Terrasil Cream, Disp: , Rfl:    VICTOZA 18 MG/3ML SOPN, Inject 1.2  mg into the skin every morning., Disp: , Rfl:

## 2024-04-04 NOTE — Patient Instructions (Addendum)
 Scott Mayo, thank you for joining Chiquita CHRISTELLA Barefoot, NP for today's virtual visit.  While this provider is not your primary care provider (PCP), if your PCP is located in our provider database this encounter information will be shared with them immediately following your visit.   A [facility] MyChart account gives you access to today's visit and all your visits, tests, and labs performed at [facility]  click here if you don't have a [facility] MyChart account or go to [email or web address]  Consent: (Patient) Scott Mayo provided verbal consent for this virtual visit at the beginning of the encounter.  Current Medications:  Current Outpatient Medications:    fluconazole  (DIFLUCAN ) 150 MG tablet, Take 1 tablet (150 mg total) by mouth as directed. Repeat in 3 days as needed, Disp: 2 tablet, Rfl: 0   benzonatate  (TESSALON ) 100 MG capsule, Take 1 capsule (100 mg total) by mouth 3 (three) times daily as needed for cough., Disp: 21 capsule, Rfl: 0   cyclobenzaprine (FLEXERIL) 10 MG tablet, Take 10 mg by mouth 3 (three) times daily as needed., Disp: , Rfl:    empagliflozin (JARDIANCE) 10 MG TABS tablet, Take 25 mg by mouth daily., Disp: , Rfl:    famotidine  (PEPCID ) 20 MG tablet, Take 20 mg by mouth 2 (two) times daily., Disp: , Rfl:    JARDIANCE 25 MG TABS tablet, Take 25 mg by mouth every morning., Disp: , Rfl:    Lancets (FREESTYLE) lancets, CHECK FASTING BLOOD SUGAR D, Disp: , Rfl:    MOUNJARO 5 MG/0.5ML Pen, Inject 15 mg into the skin once a week., Disp: , Rfl:    UNABLE TO FIND, Med Name: Terrasil Cream, Disp: , Rfl:    VICTOZA 18 MG/3ML SOPN, Inject 1.2 mg into the skin every morning., Disp: , Rfl:    Medications ordered in this encounter:  Meds ordered this encounter  Medications   fluconazole  (DIFLUCAN ) 150 MG tablet    Sig: Take 1 tablet (150 mg total) by mouth as directed. Repeat in 3 days as needed    Dispense:  2 tablet    Refill:  0    Supervising  Provider:   LAMPTEY, PHILIP O [[id number]]     *If you need refills on other medications prior to your next appointment, please contact your pharmacy*  Follow-Up: Call back or seek an in-person evaluation if the symptoms worsen or if the condition fails to improve as anticipated.  [facility] [phone]  Other Instructions Balanitis  Balanitis is swelling and irritation of the head of the penis (glans penis). Balanitis occurs most often among males who have not had their foreskin removed (uncircumcised). In uncircumcised males, the condition may also cause inflammation of the skin around the foreskin. Balanitis sometimes causes scarring of the penis or foreskin, which can require surgery. This condition may develop because of an infection or another medical condition. Untreated balanitis can increase the risk of penile cancer. What are the causes? Common causes of this condition include: Irritation and lack of airflow due to fluid (smegma) that can build up on the glans penis. Poor personal hygiene, especially in uncircumcised males. Not cleaning the glans penis and foreskin well can result in a buildup of bacteria, viruses, and yeast, which can lead to infection and inflammation. Other causes include: Chemical irritation from products such as soaps or shower gels, especially those that have fragrance. Chemical irritation can also be caused by condoms, personal lubricants, petroleum jelly, spermicides, fabric softeners, or laundry  detergents. Skin conditions, such as eczema, dermatitis, and psoriasis. Allergies to medicines, such as tetracycline and sulfa  drugs. What increases the risk? The following factors may make you more likely to develop this condition: Being an uncircumcised male. Having diabetes. Having other medical conditions, including liver cirrhosis, congestive heart failure, or kidney disease. Having infections, such as candidiasis, HPV (human papillomavirus),  herpes simplex, gonorrhea, or syphilis. Having a tight foreskin that is difficult to pull back (retract) past the glans penis. Being severely obese. History of reactive arthritis. What are the signs or symptoms? Symptoms of this condition include: Discharge from under the foreskin, and pain or difficulty retracting the foreskin. A bad smell or itchiness on the penis. Tenderness, redness, and swelling of the glans penis. A rash or sores on the glans penis or foreskin. Inability to get an erection due to pain. Trouble urinating. Scarring of the penis or foreskin, in some cases. How is this diagnosed? This condition may be diagnosed based on a physical exam and tests of a swab of discharge to check for bacterial or fungal infection. You may also have blood tests to check for: Viruses that can cause balanitis. A high blood sugar (glucose) level. This could be a sign of diabetes, which can increase the risk of balanitis. How is this treated? Treatment for this condition depends on the cause. Treatment may include: Improving personal hygiene. Your health care provider may recommend sitting in a bath of warm water that is deep enough to cover your hips and buttocks (sitz bath). Medicines such as: Creams or ointments to reduce swelling (steroids) or to treat an infection. Antibiotic medicine. Antifungal medicine. Having surgery to remove or cut the foreskin (circumcision). This may be done if you have scarring on the foreskin that makes it difficult to retract. Controlling other medical problems that may be causing your condition or making it worse. Follow these instructions at home: Medicines Take over-the-counter and prescription medicines only as told by your health care provider. If you were prescribed an antibiotic medicine, use it as told by your health care provider. Do not stop using the antibiotic even if you start to feel better. General instructions Do not have sex until the  condition clears up, or until your health care provider approves. Keep your penis clean and dry. Take sitz baths as recommended by your health care provider. Avoid products that irritate your skin or make symptoms worse, such as soaps and shower gels that have fragrance. Keep all follow-up visits. This is important. Contact a health care provider if: Your symptoms get worse or do not improve with home care. You develop chills or a fever. You have trouble urinating. You cannot retract your foreskin. Get help right away if: You develop severe pain. You are unable to urinate. Summary Balanitis is swelling and irritation of the head of the penis (glans penis). This condition is most common among uncircumcised males. Balanitis causes pain, redness, and swelling of the glans penis. Good personal hygiene is important. Treatment may include improving personal hygiene and applying creams or ointments. Contact a health care provider if your symptoms get worse or do not improve with home care. This information is not intended to replace advice given to you by your health care provider. Make sure you discuss any questions you have with your health care provider. Document Revised: 09/18/2020 Document Reviewed: 09/18/2020 Elsevier Patient Education  2024 [employer].   If you have been instructed to have an in-person evaluation today at a local Urgent  Care facility, please use the link below. It will take you to a list of all of our available [facility], including address, phone number and hours of operation. Please do not delay care.  [facility]  If you or a family member do not have a primary care provider, use the link below to schedule a visit and establish care. When you choose a [facility] primary care physician or advanced practice provider, you gain a long-term partner in health. Find a Primary Care Provider  Learn more about [facility] in-office and virtual  care options: [facility] - Get Care Now
